# Patient Record
Sex: Male | Born: 1975
Health system: Southern US, Community
[De-identification: ages and names within clinical notes are randomized; demographics above are authoritative.]

## PROBLEM LIST (undated history)

## (undated) DIAGNOSIS — B001 Herpesviral vesicular dermatitis: Secondary | ICD-10-CM

## (undated) HISTORY — DX: Herpesviral vesicular dermatitis: B00.1

## (undated) HISTORY — PX: HERNIA REPAIR: SHX51

## (undated) HISTORY — PX: WISDOM TOOTH EXTRACTION: SHX21

## (undated) HISTORY — PX: INGUINAL HERNIA REPAIR: SUR1180

## (undated) HISTORY — PX: UMBILICAL HERNIA REPAIR: SUR1181

## (undated) HISTORY — PX: KIDNEY SURGERY: SHX687

---

## 2004-08-06 ENCOUNTER — Encounter: Admission: RE | Admit: 2004-08-06 | Discharge: 2004-08-06 | Payer: Self-pay | Admitting: Occupational Medicine

## 2008-10-20 ENCOUNTER — Ambulatory Visit: Payer: Self-pay | Admitting: Occupational Medicine

## 2008-10-20 DIAGNOSIS — B001 Herpesviral vesicular dermatitis: Secondary | ICD-10-CM

## 2008-10-20 HISTORY — DX: Herpesviral vesicular dermatitis: B00.1

## 2012-01-24 DIAGNOSIS — E349 Endocrine disorder, unspecified: Secondary | ICD-10-CM | POA: Insufficient documentation

## 2016-03-31 ENCOUNTER — Emergency Department: Payer: Self-pay

## 2016-03-31 ENCOUNTER — Emergency Department (INDEPENDENT_AMBULATORY_CARE_PROVIDER_SITE_OTHER): Payer: Self-pay

## 2016-03-31 ENCOUNTER — Emergency Department (INDEPENDENT_AMBULATORY_CARE_PROVIDER_SITE_OTHER)
Admission: EM | Admit: 2016-03-31 | Discharge: 2016-03-31 | Disposition: A | Discharge status: Home / Self Care | Payer: Managed Care, Other (non HMO) | Source: Home / Self Care | Attending: Emergency Medicine | Admitting: Emergency Medicine

## 2016-03-31 ENCOUNTER — Encounter: Payer: Self-pay | Admitting: Emergency Medicine

## 2016-03-31 DIAGNOSIS — S2222XA Fracture of body of sternum, initial encounter for closed fracture: Secondary | ICD-10-CM

## 2016-03-31 DIAGNOSIS — S2220XA Unspecified fracture of sternum, initial encounter for closed fracture: Secondary | ICD-10-CM | POA: Diagnosis not present

## 2016-03-31 DIAGNOSIS — X58XXXA Exposure to other specified factors, initial encounter: Secondary | ICD-10-CM

## 2016-03-31 DIAGNOSIS — R0781 Pleurodynia: Secondary | ICD-10-CM

## 2016-03-31 MED ORDER — HYDROCODONE-ACETAMINOPHEN 5-325 MG PO TABS: 1.0000 | ORAL_TABLET | ORAL | Status: DC | PRN
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
## 2016-04-12 ENCOUNTER — Ambulatory Visit (INDEPENDENT_AMBULATORY_CARE_PROVIDER_SITE_OTHER): Payer: Managed Care, Other (non HMO) | Admitting: Family Medicine

## 2016-04-12 ENCOUNTER — Ambulatory Visit (INDEPENDENT_AMBULATORY_CARE_PROVIDER_SITE_OTHER): Payer: Managed Care, Other (non HMO)

## 2016-04-12 ENCOUNTER — Encounter: Payer: Self-pay | Admitting: Family Medicine

## 2016-04-12 VITALS — BP 120/82 | HR 74 | Ht 71.0 in | Wt 174.0 lb

## 2016-04-12 DIAGNOSIS — M419 Scoliosis, unspecified: Secondary | ICD-10-CM

## 2016-04-12 DIAGNOSIS — M542 Cervicalgia: Secondary | ICD-10-CM

## 2016-04-12 DIAGNOSIS — S2220XD Unspecified fracture of sternum, subsequent encounter for fracture with routine healing: Secondary | ICD-10-CM | POA: Diagnosis not present

## 2016-04-12 DIAGNOSIS — M25519 Pain in unspecified shoulder: Secondary | ICD-10-CM

## 2016-04-12 DIAGNOSIS — E291 Testicular hypofunction: Secondary | ICD-10-CM

## 2016-04-12 DIAGNOSIS — E349 Endocrine disorder, unspecified: Secondary | ICD-10-CM

## 2016-04-12 DIAGNOSIS — S2220XA Unspecified fracture of sternum, initial encounter for closed fracture: Secondary | ICD-10-CM | POA: Insufficient documentation

## 2016-04-12 NOTE — Patient Instructions (Signed)
Thank you for coming in today. Get early morning fasting labs soon.  Attend PT.  Get xray today.  Recheck back and neck in 1-2 months.  Continue naproxen for pain.  Take prilosec or pepcid to protect your stomach from ulcers.
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
## 2016-04-12 NOTE — Assessment & Plan Note (Signed)
Still tender. Continue naproxen. Recheck x-ray today.

## 2016-04-12 NOTE — Progress Notes (Signed)
Quick Note:  Xray sternum shows a continued fracture which is expected. Back and Neck Xray are pretty normal. ______

## 2016-04-12 NOTE — Assessment & Plan Note (Signed)
Check testosterone TSH LH FSH. Additionally obtain other fasting labs.

## 2016-04-12 NOTE — Assessment & Plan Note (Signed)
Neck and back pain likely myofascial. Check x-ray T-spine and C-spine. Refer to physical therapy. Use naproxen for pain

## 2016-04-16 LAB — COMPREHENSIVE METABOLIC PANEL
ALBUMIN: 4.2 g/dL (ref 3.6–5.1)
ALK PHOS: 141 U/L — AB (ref 40–115)
ALT: 27 U/L (ref 9–46)
AST: 19 U/L (ref 10–40)
BILIRUBIN TOTAL: 0.8 mg/dL (ref 0.2–1.2)
BUN: 15 mg/dL (ref 7–25)
CALCIUM: 9.5 mg/dL (ref 8.6–10.3)
CO2: 26 mmol/L (ref 20–31)
CREATININE: 1.05 mg/dL (ref 0.60–1.35)
Chloride: 105 mmol/L (ref 98–110)
Glucose, Bld: 90 mg/dL (ref 65–99)
Potassium: 4.1 mmol/L (ref 3.5–5.3)
SODIUM: 140 mmol/L (ref 135–146)
Total Protein: 7.5 g/dL (ref 6.1–8.1)

## 2016-04-16 LAB — CBC
HCT: 43.9 % (ref 38.5–50.0)
Hemoglobin: 15.2 g/dL (ref 13.2–17.1)
MCH: 31.9 pg (ref 27.0–33.0)
MCHC: 34.6 g/dL (ref 32.0–36.0)
MCV: 92 fL (ref 80.0–100.0)
MPV: 11.8 fL (ref 7.5–12.5)
Platelets: 253 10*3/uL (ref 140–400)
RBC: 4.77 MIL/uL (ref 4.20–5.80)
RDW: 13.1 % (ref 11.0–15.0)
WBC: 6.8 10*3/uL (ref 3.8–10.8)

## 2016-04-16 LAB — HEMOGLOBIN A1C
HEMOGLOBIN A1C: 5.3 % (ref <5.7)
Mean Plasma Glucose: 105 mg/dL

## 2016-04-16 LAB — LIPID PANEL
Cholesterol: 195 mg/dL (ref 125–200)
HDL: 44 mg/dL (ref >=40)
LDL CALC: 129 mg/dL (ref <130)
TRIGLYCERIDES: 112 mg/dL (ref <150)
Total CHOL/HDL Ratio: 4.4 Ratio (ref <=5.0)
VLDL: 22 mg/dL (ref <30)

## 2016-04-16 LAB — TESTOSTERONE: Testosterone: 412 ng/dL (ref 250–827)

## 2016-04-16 LAB — TSH: TSH: 2.03 mIU/L (ref 0.40–4.50)

## 2016-04-16 LAB — LUTEINIZING HORMONE: LH: 5.6 m[IU]/mL (ref 1.5–9.3)

## 2016-04-16 LAB — VITAMIN D 25 HYDROXY (VIT D DEFICIENCY, FRACTURES): Vit D, 25-Hydroxy: 24 ng/mL — ABNORMAL LOW (ref 30–100)

## 2016-04-16 LAB — FOLLICLE STIMULATING HORMONE: FSH: 6.1 m[IU]/mL (ref 1.6–8.0)

## 2016-04-17 ENCOUNTER — Encounter: Payer: Self-pay | Admitting: Family Medicine

## 2016-04-17 DIAGNOSIS — E559 Vitamin D deficiency, unspecified: Secondary | ICD-10-CM | POA: Insufficient documentation

## 2016-04-17 NOTE — Progress Notes (Signed)
Quick Note:  Vitamin D deficiency noted. Take 2000 units of vitamin D daily over-the-counter.  Testosterone is normal.  Cholesterol is a bit high. Work on diet and weight loss and exercise.   ______

## 2016-04-20 ENCOUNTER — Ambulatory Visit (INDEPENDENT_AMBULATORY_CARE_PROVIDER_SITE_OTHER): Payer: Managed Care, Other (non HMO) | Admitting: Rehabilitative and Restorative Service Providers"

## 2016-04-20 ENCOUNTER — Encounter: Payer: Self-pay | Admitting: Rehabilitative and Restorative Service Providers"

## 2016-04-20 DIAGNOSIS — M791 Myalgia, unspecified site: Secondary | ICD-10-CM

## 2016-04-20 DIAGNOSIS — R293 Abnormal posture: Secondary | ICD-10-CM

## 2016-04-20 DIAGNOSIS — R29898 Other symptoms and signs involving the musculoskeletal system: Secondary | ICD-10-CM
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
## 2016-04-20 NOTE — Patient Instructions (Signed)
Axial Extension (Chin Tuck)   Can do this lying and sitting  Pull chin in and lengthen back of neck. Hold __10__ seconds while counting out loud. Repeat __10__ times. Do __several __ sessions per day.   Shoulder Blade Squeeze    Rotate shoulders back, then squeeze shoulder blades down and back. Hold 10 sec Repeat __10__ times. Do __several__ sessions per day. Can use swim noodle along spine  Abdominal Bracing With Pelvic Floor (Hook-Lying)    With neutral spine, tighten pelvic floor and abdominals sucking belly button to back bone; tighten muscles in back at waist. Hold 10 sec  Repeat _10__ times. Do _several __ times a day.   HIP: Hamstrings - Supine    Place strap around foot. Raise leg up, keep knee straight. Hold _30__ seconds. _3__ reps per set, _2 times per day  TENS UNIT: This is helpful for muscle pain and spasm.   Search and Purchase a TENS 7000 2nd edition at www.tenspros.com. It should be less than $30.     TENS unit instructions: Do not shower or bathe with the unit on Turn the unit off before removing electrodes or batteries If the electrodes lose stickiness add a drop of water to the electrodes after they are disconnected from the unit and place on plastic sheet. If you continued to have difficulty, call the TENS unit company to purchase more electrodes. Do not apply lotion on the skin area prior to use. Make sure the skin is clean and dry as this will help prolong the life of the electrodes. After use, always check skin for unusual red areas, rash or other skin difficulties. If there are any skin problems, does not apply electrodes to the same area. Never remove the electrodes from the unit by pulling the wires. Do not use the TENS unit or electrodes other than as directed. Do not change electrode placement without consultating your therapist or physician. Keep 2 fingers with between each electrode.   Sleeping on Back  Place pillow under knees. A  pillow with cervical support and a roll around waist are also helpful. Copyright  VHI. All rights reserved.  Sleeping on Side Place pillow between knees. Use cervical support under neck and a roll around waist as needed. Copyright  VHI. All rights reserved.   Sleeping on Stomach   If this is the only desirable sleeping position, place pillow under lower legs, and under stomach or chest as needed.  Posture - Sitting   Sit upright, head facing forward. Try using a roll to support lower back. Keep shoulders relaxed, and avoid rounded back. Keep hips level with knees. Avoid crossing legs for long periods. Stand to Sit / Sit to Stand   To sit: Bend knees to lower self onto front edge of chair, then scoot back on seat. To stand: Reverse sequence by placing one foot forward, and scoot to front of seat. Use rocking motion to stand up.   Work Height and Reach  Ideal work height is no more than 2 to 4 inches below elbow level when standing, and at elbow level when sitting. Reaching should be limited to arm's length, with elbows slightly bent.  Bending  Bend at hips and knees, not back. Keep feet shoulder-width apart.    Posture - Standing   Good posture is important. Avoid slouching and forward head thrust. Maintain curve in low back and align ears over shoul- ders, hips over ankles.  Alternating Positions   Alternate tasks and change positions  frequently to reduce fatigue and muscle tension. Take rest breaks. Computer Work   Position work to Programmer, multimedia. Use proper work and seat height. Keep shoulders back and down, wrists straight, and elbows at right angles. Use chair that provides full back support. Add footrest and lumbar roll as needed.  Getting Into / Out of Car  Lower self onto seat, scoot back, then bring in one leg at a time. Reverse sequence to get out.  Dressing  Lie on back to pull socks or slacks over feet, or sit and bend leg while keeping back straight.     Housework - Sink  Place one foot on ledge of cabinet under sink when standing at sink for prolonged periods.   Pushing / Pulling  Pushing is preferable to pulling. Keep back in proper alignment, and use leg muscles to do the work.  Deep Squat   Squat and lift with both arms held against upper trunk. Tighten stomach muscles without holding breath. Use smooth movements to avoid jerking.  Avoid Twisting   Avoid twisting or bending back. Pivot around using foot movements, and bend at knees if needed when reaching for articles.  Carrying Luggage   Distribute weight evenly on both sides. Use a cart whenever possible. Do not twist trunk. Move body as a unit.   Lifting Principles .Maintain proper posture and head alignment. .Slide object as close as possible before lifting. .Move obstacles out of the way. .Test before lifting; ask for help if too heavy. .Tighten stomach muscles without holding breath. .Use smooth movements; do not jerk. .Use legs to do the work, and pivot with feet. .Distribute the work load symmetrically and close to the center of trunk. .Push instead of pull whenever possible.   Ask For Help   Ask for help and delegate to others when possible. Coordinate your movements when lifting together, and maintain the low back curve.  Log Roll   Lying on back, bend left knee and place left arm across chest. Roll all in one movement to the right. Reverse to roll to the left. Always move as one unit. Housework - Sweeping  Use long-handled equipment to avoid stooping.   Housework - Wiping  Position yourself as close as possible to reach work surface. Avoid straining your back.  Laundry - Unloading Wash   To unload small items at bottom of washer, lift leg opposite to arm being used to reach.  [place] close to area to be raked. Use arm movements to do the work. Keep back straight and avoid twisting.     Cart  When reaching into cart  with one arm, lift opposite leg to keep back straight.   Getting Into / Out of Bed  Lower self to lie down on one side by raising legs and lowering head at the same time. Use arms to assist moving without twisting. Bend both knees to roll onto back if desired. To sit up, start from lying on side, and use same move-ments in reverse. Housework - Vacuuming  Hold the vacuum with arm held at side. Step back and forth to move it, keeping head up. Avoid twisting.   Laundry - IT consultant so that bending and twisting can be avoided.   Laundry - Unloading Dryer  Squat down to reach into clothes dryer or use a reacher.  Gardening - Weeding / Probation officer or Kneel. Knee pads may be helpful.

## 2016-04-26 ENCOUNTER — Ambulatory Visit (INDEPENDENT_AMBULATORY_CARE_PROVIDER_SITE_OTHER): Payer: Managed Care, Other (non HMO) | Admitting: Physical Therapy

## 2016-04-26 DIAGNOSIS — M791 Myalgia, unspecified site: Secondary | ICD-10-CM

## 2016-04-26 DIAGNOSIS — R293 Abnormal posture: Secondary | ICD-10-CM

## 2016-04-26 DIAGNOSIS — R29898 Other symptoms and signs involving the musculoskeletal system: Secondary | ICD-10-CM

## 2016-04-26 NOTE — Patient Instructions (Addendum)
Supine With Rotation    Lie, back flat, legs bent, feet together. Rotate knees to one side. Hold __5-10_ seconds. Repeat to other side. Repeat _5-10__ times per session. Do _1-2__ sessions per day.   Side Pull: Double Arm   On back, knees bent, feet flat. Arms perpendicular to body, shoulder level, elbows straight but relaxed. Pull arms out to sides, elbows straight. Resistance band comes across collarbones, hands toward floor. Hold momentarily. Slowly return to starting position. Repeat _10__ times. Band color __green___   Sash   On back, knees bent, feet flat, left hand on left hip, right hand above left. Pull right arm DIAGONALLY (hip to shoulder) across chest. Bring right arm along head toward floor. Hold momentarily. Slowly return to starting position. Thumb up like hitchhiking.  Repeat _10__ times, 2 sets. Do with left arm. Band color _green_____   [facility] [place], [place] [address]  [phone] (office) [phone] (fax)

## 2016-04-26 NOTE — Therapy (Signed)
[place], [place], [address] Phone: [phone]   Fax:  [phone]  Physical Therapy Treatment  Patient Details  Name: Allen Mcpherson MRN: [id number] Date of Birth: 1976-04-20 Referring Provider: Dr. Georgina Snell   Encounter Date: 04/26/2016      PT End of Session - 04/26/16 0803    Visit Number 2   Number of Visits 12   Date for PT Re-Evaluation 06/01/16   PT Start Time 0803   PT Stop Time 0853   PT Time Calculation (min) 50 min      No past medical history on file.  Past Surgical History  Procedure Laterality Date  . Kidney surgery    . Hernia repair      There were no vitals filed for this visit.      Subjective Assessment - 04/26/16 0804    Subjective Pt reports he performed snow angels a few times since last visit. "I've been slacking" regarding HEP.  Pt continues to have pain with coughing and sneezing.     Currently in Pain? Yes   Pain Score 4    Pain Location Neck   Pain Orientation Lower;Left;Right   Pain Descriptors / Indicators Aching   Aggravating Factors  sitting too long    Pain Relieving Factors moving around             [facility] PT Assessment - 04/26/16 0001    Assessment   Medical Diagnosis Fracture of sternum; neck pain; LBP    Referring Provider Dr. Georgina Snell    Onset Date/Surgical Date 03/25/16   Hand Dominance Right   Next MD Visit none secheduled    Prior Therapy none           [facility] Adult PT Treatment/Exercise - 04/26/16 0001    Exercises   Exercises Shoulder;Neck;Lumbar   Neck Exercises: Seated   Neck Retraction 10 reps;3 secs  tactile cues to correct form.   Lumbar Exercises: Stretches   Passive Hamstring Stretch 2 reps;30 seconds  supine with strap    Passive Hamstring Stretch Limitations then 2 reps each leg seated    Lower Trunk Rotation 5 reps;10 seconds  with arms in T   Piriformis Stretch 2 reps;30 seconds  seated    Lumbar Exercises:  Supine   Other Supine Lumbar Exercises Hooklying on 1/2 foam roll:  snow angel position static for 2 min, then 10 reps of abd ~ 120 deg   Shoulder Exercises: Supine   Other Supine Exercises Scap squeeze x 5 sec hold x 10 reps   Other Supine Exercises D2 flexion with red band x 5 reps each arm, then 5 reps with green band;  bilat horiz abd with green band x 10 reps    Moist Heat Therapy   Number Minutes Moist Heat 15 Minutes   Moist Heat Location Cervical;Lumbar Spine  mid back   Electrical Stimulation   Electrical Stimulation Location bilat lower cervical paraspinals and upper thoracic paraspinals   Electrical Stimulation Action IFC   Electrical Stimulation Parameters to tolerance    Electrical Stimulation Goals Pain;Tone                PT Education - 04/26/16 [phone]    Education provided Yes   Education Details HEP    Person(s) Educated Patient   Methods Explanation;Handout   Comprehension Verbalized understanding;Returned demonstration             PT Long Term Goals - 04/20/16 0827  PT LONG TERM GOAL #1   Title Improve posture and alignment with patient to demonstrate upright posture through cervical and thoracic spine with improved position of scapulae along the thoracic wall   06/01/16   Time 6   Period Weeks   Status New   PT LONG TERM GOAL #2   Title Cervical ROM WNL's and painfree   06/01/16   Time 6   Period Weeks   Status New   PT LONG TERM GOAL #3   Title Patient reports that he can cough and sneeze without chest pain 06/01/16   Time 6   Period Weeks   Status New   PT LONG TERM GOAL #4   Title Independent in HEP 06/01/16   Time 6   Period Weeks   Status New   PT LONG TERM GOAL #5   Title Improve FOTO to </= 25% limitation  06/01/16   Time 6   Period Weeks   Status New               Plan - 04/26/16 [id number]    Clinical Impression Statement Pt tolerated all exercises, reporting some relief of symptoms afterwards.  Pain further reduced with  use of estim and MHP.  Pt encouraged to complete HEP outside of therapy session so as to meet his established goals.  Progressing towards goals.    Rehab Potential Good   PT Frequency 2x / week   PT Duration 6 weeks   PT Treatment/Interventions Patient/family education;ADLs/Self Care Home Management;Neuromuscular re-education;Manual techniques;Dry needling;Cryotherapy;Electrical Stimulation;Iontophoresis 4mg /ml Dexamethasone;Moist Heat;Ultrasound;Traction;Therapeutic activities;Therapeutic exercise   PT Next Visit Plan postural correction/education; ROM; strengthening; maunal work; modalities as indicated      Patient will benefit from skilled therapeutic intervention in order to improve the following deficits and impairments:  Postural dysfunction, Improper body mechanics, Pain, Increased fascial restricitons, Increased muscle spasms, Decreased range of motion, Decreased strength, Decreased mobility, Decreased endurance, Decreased activity tolerance  Visit Diagnosis: Other symptoms and signs involving the musculoskeletal system  Abnormal posture  Myalgia     Problem List Patient Active Problem List   Diagnosis Date Noted  . Vitamin D deficiency 04/17/2016  . Fracture, sternum closed 04/12/2016  . Neck pain 04/12/2016  . HERPES LABIALIS 10/20/2008   Allen Mcpherson, PTA 04/26/2016 8:52 AM  [facility] [place], [place], [address] Phone: [phone]   Fax:  [phone]  Name: Allen Mcpherson MRN: [id number] Date of Birth: [birth year]

## 2016-04-28 ENCOUNTER — Encounter: Payer: Self-pay | Admitting: Rehabilitative and Restorative Service Providers"

## 2016-04-28 ENCOUNTER — Ambulatory Visit (INDEPENDENT_AMBULATORY_CARE_PROVIDER_SITE_OTHER): Payer: Managed Care, Other (non HMO) | Admitting: Rehabilitative and Restorative Service Providers"

## 2016-04-28 DIAGNOSIS — R293 Abnormal posture: Secondary | ICD-10-CM | POA: Diagnosis not present

## 2016-04-28 DIAGNOSIS — R29898 Other symptoms and signs involving the musculoskeletal system: Secondary | ICD-10-CM

## 2016-04-28 DIAGNOSIS — M791 Myalgia, unspecified site: Secondary | ICD-10-CM

## 2016-04-28 NOTE — Patient Instructions (Addendum)
Scapula Adduction With Pectoralis Stretch: Low - Standing   Shoulders at 45 hands even with shoulders, keeping weight through legs, shift weight forward until you feel pull or stretch through the front of your chest. Hold _30__ seconds. Do _3__ times, _2-4__ times per day.   Scapula Adduction With Pectoralis Stretch: Mid-Range - Standing   Shoulders at 90 elbows even with shoulders, keeping weight through legs, shift weight forward until you feel pull or strength through the front of your chest. Hold __30_ seconds. Do _3__ times, __2-4_ times per day.   Scapula Adduction With Pectoralis Stretch: High - Standing   Shoulders at 120 hands up high on the doorway, keeping weight on feet, shift weight forward until you feel pull or stretch through the front of your chest. Hold _30__ seconds. Do _3__ times, _2-3__ times per day.  Resisted External Rotation: in Neutral - Bilateral   PALMS UP Sit or stand, tubing in both hands, elbows at sides, bent to 90, forearms forward. Pinch shoulder blades together and rotate forearms out. Keep elbows at sides. Repeat __10__ times per set. Do _2-3___ sets per session. Do _2-3___ sessions per day.

## 2016-04-28 NOTE — Therapy (Signed)
[place], [place], [address] Phone: [phone]   Fax:  [phone]  Physical Therapy Treatment  Patient Details  Name: Allen Mcpherson MRN: [id number] Date of Birth: 1976/06/27 Referring Provider: Dr. Georgina Snell   Encounter Date: 04/28/2016      PT End of Session - 04/28/16 0811    Visit Number 3   Number of Visits 12   Date for PT Re-Evaluation 06/01/16   PT Start Time 0805   PT Stop Time 0858   PT Time Calculation (min) 53 min   Activity Tolerance Patient tolerated treatment well      History reviewed. No pertinent past medical history.  Past Surgical History  Procedure Laterality Date  . Kidney surgery    . Hernia repair      There were no vitals filed for this visit.      Subjective Assessment - 04/28/16 0812    Subjective Patient reports that he had a "sneezing attack" yesterday and had increased pain in the mid back area.    Currently in Pain? Yes   Pain Score 3    Pain Location Back  btn shoulder blades    Pain Orientation Mid   Pain Descriptors / Indicators Aching   Pain Type Acute pain   Pain Onset More than a month ago   Pain Frequency Intermittent                         [facility] Adult PT Treatment/Exercise - 04/28/16 0001    Therapeutic Activites    Therapeutic Activities --  myofacial release with golf balls occipital area   Neck Exercises: Theraband   Scapula Retraction 20 reps  yellow TB with noodle    Neck Exercises: Supine   Neck Retraction 10 reps;10 secs   Shoulder Exercises: Standing   Retraction Both;20 reps;Theraband   Theraband Level (Shoulder Retraction) Level 1 (Yellow)  with noodle    Other Standing Exercises scap squeeze with noodle 10 sec x 10    Shoulder Exercises: ROM/Strengthening   Other ROM/Strengthening Exercises Nustep L4 arms and legs x5 min    Moist Heat Therapy   Number Minutes Moist Heat 15 Minutes   Moist Heat Location  Cervical;Lumbar Spine  mid back   Electrical Stimulation   Electrical Stimulation Location bilat lower cervical paraspinals and upper thoracic paraspinals   Electrical Stimulation Action IFC   Electrical Stimulation Parameters to tolerance   Electrical Stimulation Goals Pain;Tone   Manual Therapy   Manual therapy comments pt prone   Soft tissue mobilization through the cervical and thoraic paraspinals and cervical musculature    Myofascial Release thoracic spine - mid scapular area    Passive ROM gentle cervical stretch laterally   Manual Traction manual traction mod pull 5 reps x 20-30 sec                 PT Education - 04/28/16 0818    Education provided Yes   Education Details HEP   Person(s) Educated Patient   Methods Explanation;Demonstration;Tactile cues;Verbal cues;Handout   Comprehension Verbalized understanding;Returned demonstration;Verbal cues required;Tactile cues required             PT Long Term Goals - 04/28/16 0813    PT LONG TERM GOAL #1   Title Improve posture and alignment with patient to demonstrate upright posture through cervical and thoracic spine with improved position of scapulae along the thoracic wall   06/01/16  Time 6   Period Weeks   Status On-going   PT LONG TERM GOAL #2   Title Cervical ROM WNL's and painfree   06/01/16   Time 6   Period Weeks   Status On-going   PT LONG TERM GOAL #3   Title Patient reports that he can cough and sneeze without chest pain 06/01/16   Time 6   Period Weeks   Status On-going   PT LONG TERM GOAL #4   Title Independent in HEP 06/01/16   Time 6   Period Weeks   Status On-going   PT LONG TERM GOAL #5   Title Improve FOTO to </= 25% limitation  06/01/16   Time 6   Period Weeks   Status On-going               Plan - 04/28/16 [id number]    Clinical Impression Statement Progressing with exercises - persistent symptoms but progressing toward stated goals of therapy.    Rehab Potential Good   PT  Frequency 2x / week   PT Duration 6 weeks   PT Treatment/Interventions Patient/family education;ADLs/Self Care Home Management;Neuromuscular re-education;Manual techniques;Dry needling;Cryotherapy;Electrical Stimulation;Iontophoresis 4mg /ml Dexamethasone;Moist Heat;Ultrasound;Traction;Therapeutic activities;Therapeutic exercise   PT Next Visit Plan postural correction/education; ROM; strengthening; maunal work; modalities as indicated   PT Home Exercise Plan HEP; myofacial release work   Oncologist with Plan of Care Patient      Patient will benefit from skilled therapeutic intervention in order to improve the following deficits and impairments:  Postural dysfunction, Improper body mechanics, Pain, Increased fascial restricitons, Increased muscle spasms, Decreased range of motion, Decreased strength, Decreased mobility, Decreased endurance, Decreased activity tolerance  Visit Diagnosis: Other symptoms and signs involving the musculoskeletal system  Abnormal posture  Myalgia     Problem List Patient Active Problem List   Diagnosis Date Noted  . Vitamin D deficiency 04/17/2016  . Fracture, sternum closed 04/12/2016  . Neck pain 04/12/2016  . HERPES LABIALIS 10/20/2008    Celyn Nilda Simmer PT, MPH  04/28/2016, 8:48 AM  [facility] [place], [place], [address] Phone: [phone]   Fax:  [phone]  Name: Allen Mcpherson MRN: [id number] Date of Birth: 1975/12/27

## 2016-05-03 ENCOUNTER — Encounter (INDEPENDENT_AMBULATORY_CARE_PROVIDER_SITE_OTHER): Payer: Self-pay

## 2016-05-03 ENCOUNTER — Encounter: Payer: Self-pay | Admitting: Rehabilitative and Restorative Service Providers"

## 2016-05-03 ENCOUNTER — Ambulatory Visit (INDEPENDENT_AMBULATORY_CARE_PROVIDER_SITE_OTHER): Payer: Managed Care, Other (non HMO) | Admitting: Rehabilitative and Restorative Service Providers"

## 2016-05-03 DIAGNOSIS — R29898 Other symptoms and signs involving the musculoskeletal system: Secondary | ICD-10-CM | POA: Diagnosis not present

## 2016-05-03 DIAGNOSIS — M791 Myalgia, unspecified site: Secondary | ICD-10-CM

## 2016-05-03 DIAGNOSIS — R293 Abnormal posture: Secondary | ICD-10-CM | POA: Diagnosis not present

## 2016-05-03 NOTE — Patient Instructions (Addendum)
Resisted External Rotation: in Neutral - Bilateral   PALMS UP Sit or stand, tubing in both hands, elbows at sides, bent to 90, forearms forward. Pinch shoulder blades together and rotate forearms out. Keep elbows at sides. Repeat __10__ times per set. Do _2-3___ sets per session. Do _2-3___ sessions per day.   Low Row: Standing   Face anchor, feet shoulder width apart. Palms up, pull arms back, squeezing shoulder blades together. Repeat 10__ times per set. Do 2-3__ sets per session. Do 2-3__ sessions per week. Anchor Height: Waist   Strengthening: Resisted Extension   Hold tubing in right hand, arm forward. Pull arm back, elbow straight. Repeat _10___ times per set. Do 2-3____ sets per session. Do 2-3____ sessions per day.  Cat / Cow Flow    Inhale, press spine toward ceiling like a Halloween cat. Keeping strength in arms and abdominals, exhale to soften spine through neutral and into cow pose. Open chest and arch back. Initiate movement between cat and cow at tailbone, one vertebrae at a time. Repeat __5__ times.   Trigger Point Dry Needling  . What is Trigger Point Dry Needling (DN)? o DN is a physical therapy technique used to treat muscle pain and dysfunction. Specifically, DN helps deactivate muscle trigger points (muscle knots).  o A thin filiform needle is used to penetrate the skin and stimulate the underlying trigger point. The goal is for a local twitch response (LTR) to occur and for the trigger point to relax. No medication of any kind is injected during the procedure.   . What Does Trigger Point Dry Needling Feel Like?  o The procedure feels different for each individual patient. Some patients report that they do not actually feel the needle enter the skin and overall the process is not painful. Very mild bleeding may occur. However, many patients feel a deep cramping in the muscle in which the needle was inserted. This is the local twitch response.   Marland Kitchen How Will I  feel after the treatment? o Soreness is normal, and the onset of soreness may not occur for a few hours. Typically this soreness does not last longer than two days.  o Bruising is uncommon, however; ice can be used to decrease any possible bruising.  o In rare cases feeling tired or nauseous after the treatment is normal. In addition, your symptoms may get worse before they get better, this period will typically not last longer than 24 hours.   . What Can I do After My Treatment? o Increase your hydration by drinking more water for the next 24 hours. o You may place ice or heat on the areas treated that have become sore, however, do not use heat on inflamed or bruised areas. Heat often brings more relief post needling. o You can continue your regular activities, but vigorous activity is not recommended initially after the treatment for 24 hours. o DN is best combined with other physical therapy such as strengthening, stretching, and other therapies.

## 2016-05-03 NOTE — Therapy (Signed)
[place], [place], [address] Phone: [phone]   Fax:  [phone]  Physical Therapy Treatment  Patient Details  Name: Allen Mcpherson MRN: [id number] Date of Birth: 1975/12/19 Referring Provider: Dr. Georgina Snell   Encounter Date: 05/03/2016      PT End of Session - 05/03/16 0807    Visit Number 4   Number of Visits 12   Date for PT Re-Evaluation 06/01/16   PT Start Time 0805   PT Stop Time 0858   PT Time Calculation (min) 53 min   Activity Tolerance Patient tolerated treatment well      History reviewed. No pertinent past medical history.  Past Surgical History  Procedure Laterality Date  . Kidney surgery    . Hernia repair      There were no vitals filed for this visit.      Subjective Assessment - 05/03/16 0808    Subjective Patient reports that he continues to improve. The last treatment "when you stretched me out helped a lot". He is doing his exercises at home without difficutly.    Currently in Pain? Yes   Pain Score 2    Pain Location Back   Pain Orientation Mid   Pain Descriptors / Indicators Aching   Pain Type Acute pain   Pain Onset More than a month ago   Pain Frequency Intermittent                         [facility] Adult PT Treatment/Exercise - 05/03/16 0001    Neck Exercises: Theraband   Scapula Retraction 20 reps  yellow TB with noodle    Lumbar Exercises: Stretches   Passive Hamstring Stretch 2 reps;30 seconds   Passive Hamstring Stretch Limitations then 2 reps each leg seated    Piriformis Stretch 2 reps;30 seconds  seated   Lumbar Exercises: Quadruped   [place]/Old Horse 5 reps   Shoulder Exercises: Standing   Extension Strengthening;Both;20 reps;Theraband   Theraband Level (Shoulder Extension) Level 2 (Red)   Row Strengthening;Both;20 reps;Theraband   Theraband Level (Shoulder Row) Level 2 (Red)   Retraction Both;20 reps;Theraband   Theraband  Level (Shoulder Retraction) Level 1 (Yellow)  with noodle    Other Standing Exercises scap squeeze with noodle 10 sec x 10    Shoulder Exercises: ROM/Strengthening   Other ROM/Strengthening Exercises Nustep L5 arms and legs x5 min    Moist Heat Therapy   Number Minutes Moist Heat 15 Minutes   Moist Heat Location Cervical;Lumbar Spine  mid back   Electrical Stimulation   Electrical Stimulation Location bilat lower cervical paraspinals and upper thoracic paraspinals   Electrical Stimulation Action IFC   Electrical Stimulation Parameters to tolerance   Electrical Stimulation Goals Pain;Tone   Manual Therapy   Manual therapy comments pt prone   Soft tissue mobilization through the cervical and thoraic paraspinals and cervical musculature    Myofascial Release thoracic spine - mid scapular area    Passive ROM gentle cervical stretch laterally   Manual Traction manual traction mod pull 5 reps x 20-30 sec                 PT Education - 05/03/16 0818    Education provided Yes   Education Details HEP   Person(s) Educated Patient   Methods Explanation;Demonstration;Tactile cues;Verbal cues;Handout   Comprehension Verbalized understanding;Returned demonstration;Verbal cues required;Tactile cues required  PT Long Term Goals - 05/03/16 0810    PT LONG TERM GOAL #1   Title Improve posture and alignment with patient to demonstrate upright posture through cervical and thoracic spine with improved position of scapulae along the thoracic wall   06/01/16   Time 6   Period Weeks   Status On-going   PT LONG TERM GOAL #2   Title Cervical ROM WNL's and painfree   06/01/16   Time 6   Period Weeks   Status On-going   PT LONG TERM GOAL #3   Title Patient reports that he can cough and sneeze without chest pain 06/01/16   Time 6   Period Weeks   Status On-going   PT LONG TERM GOAL #4   Title Independent in HEP 06/01/16   Time 6   Period Weeks   Status On-going   PT LONG  TERM GOAL #5   Title Improve FOTO to </= 25% limitation  06/01/16   Time 6   Period Weeks   Status On-going               Plan - 05/03/16 [id number]    Clinical Impression Statement Improving with treatment and HEP. Progressing radually toward stated goals of therapy. Excellent response to manual work    Rehab Potential Good   PT Frequency 2x / week   PT Duration 6 weeks   PT Treatment/Interventions Patient/family education;ADLs/Self Care Home Management;Neuromuscular re-education;Manual techniques;Dry needling;Cryotherapy;Electrical Stimulation;Iontophoresis 4mg /ml Dexamethasone;Moist Heat;Ultrasound;Traction;Therapeutic activities;Therapeutic exercise   PT Next Visit Plan postural correction/education; ROM; strengthening; maunal work; modalities as indicated consider TDN as indicated traps Lt and thoracic paraspinals   PT Home Exercise Plan HEP; myofacial release work   Oncologist with Plan of Care Patient      Patient will benefit from skilled therapeutic intervention in order to improve the following deficits and impairments:  Postural dysfunction, Improper body mechanics, Pain, Increased fascial restricitons, Increased muscle spasms, Decreased range of motion, Decreased strength, Decreased mobility, Decreased endurance, Decreased activity tolerance  Visit Diagnosis: Other symptoms and signs involving the musculoskeletal system  Abnormal posture  Myalgia     Problem List Patient Active Problem List   Diagnosis Date Noted  . Vitamin D deficiency 04/17/2016  . Fracture, sternum closed 04/12/2016  . Neck pain 04/12/2016  . HERPES LABIALIS 10/20/2008    Aariel Ems Nilda Simmer PT, MPH 05/03/2016, 8:49 AM  [place], [place], [address] Phone: [phone]   Fax:  [phone]  Name: Allen Mcpherson MRN: [id number] Date of Birth: 16-Jul-1976

## 2016-05-05 ENCOUNTER — Encounter: Payer: Managed Care, Other (non HMO) | Admitting: Rehabilitative and Restorative Service Providers"

## 2016-05-10 ENCOUNTER — Encounter: Payer: Managed Care, Other (non HMO) | Admitting: Rehabilitative and Restorative Service Providers"

## 2016-05-12 ENCOUNTER — Encounter: Payer: Self-pay | Admitting: Rehabilitative and Restorative Service Providers"

## 2016-05-12 ENCOUNTER — Ambulatory Visit (INDEPENDENT_AMBULATORY_CARE_PROVIDER_SITE_OTHER): Payer: Managed Care, Other (non HMO) | Admitting: Rehabilitative and Restorative Service Providers"

## 2016-05-12 DIAGNOSIS — R293 Abnormal posture: Secondary | ICD-10-CM

## 2016-05-12 DIAGNOSIS — R29898 Other symptoms and signs involving the musculoskeletal system: Secondary | ICD-10-CM | POA: Diagnosis not present

## 2016-05-12 DIAGNOSIS — M791 Myalgia, unspecified site: Secondary | ICD-10-CM

## 2016-05-12 NOTE — Patient Instructions (Signed)
Upper / Lower Extremity Extension (All-Fours)    Tighten core and raise right leg and opposite arm. Keep trunk rigid. Repeat _10___ times per set. Do __1-2__ sets per session. Do __1__ sessions per day.   Trigger Point Dry Needling  . What is Trigger Point Dry Needling (DN)? o DN is a physical therapy technique used to treat muscle pain and dysfunction. Specifically, DN helps deactivate muscle trigger points (muscle knots).  o A thin filiform needle is used to penetrate the skin and stimulate the underlying trigger point. The goal is for a local twitch response (LTR) to occur and for the trigger point to relax. No medication of any kind is injected during the procedure.   . What Does Trigger Point Dry Needling Feel Like?  o The procedure feels different for each individual patient. Some patients report that they do not actually feel the needle enter the skin and overall the process is not painful. Very mild bleeding may occur. However, many patients feel a deep cramping in the muscle in which the needle was inserted. This is the local twitch response.   Marland Kitchen How Will I feel after the treatment? o Soreness is normal, and the onset of soreness may not occur for a few hours. Typically this soreness does not last longer than two days.  o Bruising is uncommon, however; ice can be used to decrease any possible bruising.  o In rare cases feeling tired or nauseous after the treatment is normal. In addition, your symptoms may get worse before they get better, this period will typically not last longer than 24 hours.   . What Can I do After My Treatment? o Increase your hydration by drinking more water for the next 24 hours. o You may place ice or heat on the areas treated that have become sore, however, do not use heat on inflamed or bruised areas. Heat often brings more relief post needling. o You can continue your regular activities, but vigorous activity is not recommended initially after the  treatment for 24 hours. o DN is best combined with other physical therapy such as strengthening, stretching, and other therapies.  o

## 2016-05-12 NOTE — Therapy (Signed)
[place], [place], [address] Phone: [phone]   Fax:  [phone]  Physical Therapy Treatment  Patient Details  Name: Allen Mcpherson MRN: [id number] Date of Birth: [birth year] Referring Provider: Dr. Georgina Snell   Encounter Date: 05/12/2016      PT End of Session - 05/12/16 0807    Visit Number 5   Number of Visits 12   Date for PT Re-Evaluation 06/01/16   PT Start Time 0804   PT Stop Time 0859   PT Time Calculation (min) 55 min   Activity Tolerance Patient tolerated treatment well      History reviewed. No pertinent past medical history.  Past Surgical History  Procedure Laterality Date  . Kidney surgery    . Hernia repair      There were no vitals filed for this visit.      Subjective Assessment - 05/12/16 0806    Subjective Patient reports that he continues to improve. He is doing his exercises at home without difficutly. Thinks he is "finally healing".   Currently in Pain? No/denies            [facility] PT Assessment - 05/12/16 0001    Assessment   Medical Diagnosis Fracture of sternum; neck pain; LBP    Referring Provider Dr. Georgina Snell    Onset Date/Surgical Date 03/25/16   Hand Dominance Right   Next MD Visit none secheduled    Prior Therapy none   AROM   Cervical Flexion 50   Cervical Extension 51   Cervical - Right Side Bend 41   Cervical - Left Side Bend 40   Cervical - Right Rotation 68   Cervical - Left Rotation 68   Palpation   Spinal mobility minimal tender and tight with CPA mobs C5/6/7 and L3/4/5    Palpation comment improving tenderness and tightness through ant/lat/post cervical musculature; upper traps; cervical and thoracic paraspinals; pecs                     [facility] Adult PT Treatment/Exercise - 05/12/16 0001    Neck Exercises: Theraband   Scapula Retraction 20 reps  yellow TB with noodle    Neck Exercises: Supine   Neck Retraction 10 reps;10  secs   Lumbar Exercises: Stretches   Passive Hamstring Stretch 2 reps;30 seconds   Passive Hamstring Stretch Limitations then 2 reps each leg seated    Piriformis Stretch 2 reps;30 seconds  seated   Lumbar Exercises: Aerobic   Stationary Bike Stepper L5 x 5 min arms and legs - arms 9    Lumbar Exercises: Supine   Ab Set --  3 part core 10 sec x 10    Lumbar Exercises: Quadruped   Opposite Arm/Leg Raise Right arm/Left leg;Left arm/Right leg;10 reps;2 seconds   Shoulder Exercises: Prone   Retraction Both;10 reps  row   Shoulder Exercises: Standing   Extension Strengthening;Both;20 reps;Theraband   Theraband Level (Shoulder Extension) Level 2 (Red)   Row Strengthening;Both;20 reps;Theraband   Theraband Level (Shoulder Row) Level 2 (Red)   Retraction Both;20 reps;Theraband   Theraband Level (Shoulder Retraction) Level 1 (Yellow)  with noodle    Other Standing Exercises scap squeeze with noodle 10 sec x 10    Shoulder Exercises: Stretch   Other Shoulder Stretches 3way doorway stretch 30 sec x 3 each    Moist Heat Therapy   Number Minutes Moist Heat 15 Minutes   Moist Heat Location Cervical;Lumbar Spine  mid back   Acupuncturist Location Lt lower cervical paraspinals and upper thoracic paraspinals   Electrical Stimulation Action IFC   Electrical Stimulation Parameters to tolerance   Electrical Stimulation Goals Pain;Tone   Manual Therapy   Manual therapy comments pt prone and supine   Soft tissue mobilization through the cervical and thoraic paraspinals and cervical musculature    Myofascial Release thoracic spine - mid scapular area    Passive ROM gentle cervical stretch laterally   Manual Traction manual traction mod pull 5 reps x 20-30 sec           Trigger Point Dry Needling - 05/12/16 0836    Consent Given? Yes   Education Handout Provided Yes   Muscles Treated Upper Body Upper trapezius;Levator scapulae;Rhomboids  Lt   Upper  Trapezius Response Twitch reponse elicited;Palpable increased muscle length   Levator Scapulae Response Twitch response elicited;Palpable increased muscle length   Rhomboids Response Twitch response elicited;Palpable increased muscle length              PT Education - 05/12/16 0818    Education provided Yes   Education Details HEP TDN   Person(s) Educated Patient   Methods Explanation;Demonstration;Tactile cues;Verbal cues;Handout   Comprehension Verbalized understanding;Returned demonstration;Verbal cues required;Tactile cues required             PT Long Term Goals - 05/12/16 0807    PT LONG TERM GOAL #1   Title Improve posture and alignment with patient to demonstrate upright posture through cervical and thoracic spine with improved position of scapulae along the thoracic wall   06/01/16   Time 6   Period Weeks   Status On-going   PT LONG TERM GOAL #2   Title Cervical ROM WNL's and painfree   06/01/16   Time 6   Period Weeks   Status On-going   PT LONG TERM GOAL #3   Title Patient reports that he can cough and sneeze without chest pain 06/01/16   Time 6   Period Weeks   Status Partially Met   PT LONG TERM GOAL #4   Title Independent in HEP 06/01/16   Time 6   Period Weeks   Status On-going   PT LONG TERM GOAL #5   Title Improve FOTO to </= 25% limitation  06/01/16   Time 6   Period Weeks   Status On-going               Plan - 05/12/16 0808    Clinical Impression Statement Continued improvement with treatment including manual work - especially passive stretching for the cervical spine. Improving pain levels and increasing functional activitiy. Progressing well toward stated goals of therapy.    Rehab Potential Good   PT Frequency 2x / week   PT Duration 6 weeks   PT Treatment/Interventions Patient/family education;ADLs/Self Care Home Management;Neuromuscular re-education;Manual techniques;Dry needling;Cryotherapy;Electrical Stimulation;Iontophoresis  91m/ml Dexamethasone;Moist Heat;Ultrasound;Traction;Therapeutic activities;Therapeutic exercise   PT Next Visit Plan postural correction/education; ROM; strengthening; maunal work; modalities as indicated consider TDN as indicated traps Lt and thoracic paraspinals   PT Home Exercise Plan HEP; myofacial release work      Patient will benefit from skilled therapeutic intervention in order to improve the following deficits and impairments:  Postural dysfunction, Improper body mechanics, Pain, Increased fascial restricitons, Increased muscle spasms, Decreased range of motion, Decreased strength, Decreased mobility, Decreased endurance, Decreased activity tolerance  Visit Diagnosis: Other symptoms and signs involving the musculoskeletal system  Abnormal posture  Myalgia  Problem List Patient Active Problem List   Diagnosis Date Noted  . Vitamin D deficiency 04/17/2016  . Fracture, sternum closed 04/12/2016  . Neck pain 04/12/2016  . HERPES LABIALIS 10/20/2008    Celyn Nilda Simmer PT, MPH  05/12/2016, 8:53 AM  [facility] [place], [place], [address] Phone: [phone]   Fax:  [phone]  Name: Allen Mcpherson MRN: [id number] Date of Birth: Sep 26, 1976

## 2016-05-17 ENCOUNTER — Encounter: Payer: Self-pay | Admitting: Rehabilitative and Restorative Service Providers"

## 2016-05-17 ENCOUNTER — Ambulatory Visit (INDEPENDENT_AMBULATORY_CARE_PROVIDER_SITE_OTHER): Payer: Managed Care, Other (non HMO) | Admitting: Rehabilitative and Restorative Service Providers"

## 2016-05-17 DIAGNOSIS — M791 Myalgia, unspecified site: Secondary | ICD-10-CM

## 2016-05-17 DIAGNOSIS — R293 Abnormal posture: Secondary | ICD-10-CM | POA: Diagnosis not present

## 2016-05-17 DIAGNOSIS — R29898 Other symptoms and signs involving the musculoskeletal system: Secondary | ICD-10-CM

## 2016-05-17 NOTE — Therapy (Addendum)
[place], [place], [address] Phone: [phone]   Fax:  [phone]  Physical Therapy Treatment  Patient Details  Name: Allen Mcpherson MRN: [id number] Date of Birth: 06/23/1976 Referring Provider: Dr. Georgina Snell  Encounter Date: 05/17/2016      PT End of Session - 05/17/16 0806    Visit Number 6   Number of Visits 12   Date for PT Re-Evaluation 06/01/16   PT Start Time 0804   PT Stop Time 0900   PT Time Calculation (min) 56 min   Activity Tolerance Patient tolerated treatment well      History reviewed. No pertinent past medical history.  Past Surgical History  Procedure Laterality Date  . Kidney surgery    . Hernia repair      There were no vitals filed for this visit.      Subjective Assessment - 05/17/16 0806    Subjective Improving - some soreness - he thinks because it rained. Did not care for the TDN. Working on his exercises at home.    Currently in Pain? Yes   Pain Score 2    Pain Location Shoulder   Pain Orientation Left   Pain Descriptors / Indicators Sore            [facility] PT Assessment - 05/17/16 0001    Assessment   Medical Diagnosis Fracture of sternum; neck pain; LBP    Referring Provider Dr. Georgina Snell   Onset Date/Surgical Date 03/25/16   Hand Dominance Right   Next MD Visit none secheduled    Prior Therapy none   Observation/Other Assessments   Focus on Therapeutic Outcomes (FOTO)  13% limitation    AROM   Cervical Flexion 52   Cervical Extension 52   Cervical - Right Side Bend 43   Cervical - Left Side Bend 41   Cervical - Right Rotation 70   Cervical - Left Rotation 70   Lumbar Flexion WFL   Lumbar Extension WFL   Lumbar - Right Side Bend WFL   Lumbar - Left Side Bend WFL   Lumbar - Right Rotation WFL   Lumbar - Left Rotation WFL   Strength   Overall Strength Comments 5/5   Flexibility   Hamstrings 75 deg bilat    Quadriceps WFL's   ITB WFL's   Piriformis WFL's   Palpation   Spinal mobility minimal tender and tight with CPA mobs C5/6/7 and L3/4/5    Palpation comment improving tenderness and tightness through ant/lat/post cervical musculature; upper traps; cervical and thoracic paraspinals; pecs                     [facility] Adult PT Treatment/Exercise - 05/17/16 0001    Shoulder Exercises: Prone   Retraction Both;10 reps  row   Shoulder Exercises: Standing   Extension Strengthening;Both;20 reps;Theraband   Theraband Level (Shoulder Extension) Level 3 (Green)   Row Strengthening;Both;20 reps;Theraband  added rpw at 45 deg x 20    Theraband Level (Shoulder Row) Level 3 (Green)   Retraction Both;20 reps;Theraband   Theraband Level (Shoulder Retraction) Level 2 (Red)  with noodle    Other Standing Exercises scap squeeze with noodle 10 sec x 10    Shoulder Exercises: ROM/Strengthening   UBE (Upper Arm Bike) L2 2 min alt fwd/back   Shoulder Exercises: Stretch   Other Shoulder Stretches 3way doorway stretch 30 sec x 3 each    Moist Heat Therapy   Number Minutes  Moist Heat 15 Minutes   Moist Heat Location Cervical;Lumbar Spine  mid back   Electrical Stimulation   Electrical Stimulation Location Lt lower cervical paraspinals and upper thoracic paraspinals   Electrical Stimulation Action IFC   Electrical Stimulation Parameters to tolerance   Electrical Stimulation Goals Pain;Tone   Manual Therapy   Manual therapy comments pt prone and supine   Soft tissue mobilization through the cervical and thoraic paraspinals and cervical musculature    Myofascial Release thoracic spine - mid scapular area    Passive ROM gentle cervical stretch laterally   Manual Traction manual traction mod pull 5 reps x 20-30 sec    Neck Exercises: Stretches   Other Neck Stretches scaleni stretches x 3 positions 30 sec x 3 reps                 PT Education - 05/17/16 0834    Education provided Yes   Education Details HEP     Person(s) Educated Patient   Methods Explanation;Demonstration;Tactile cues;Verbal cues;Handout   Comprehension Verbalized understanding;Returned demonstration;Verbal cues required;Tactile cues required             PT Long Term Goals - 05/17/16 0816    PT LONG TERM GOAL #1   Title Improve posture and alignment with patient to demonstrate upright posture through cervical and thoracic spine with improved position of scapulae along the thoracic wall   06/01/16   Time 6   Period Weeks   Status Achieved   PT LONG TERM GOAL #2   Title Cervical ROM WNL's and painfree   06/01/16   Time 6   Period Weeks   Status Achieved   PT LONG TERM GOAL #3   Title Patient reports that he can cough and sneeze without chest pain 06/01/16   Time 6   Period Weeks   Status Partially Met   PT LONG TERM GOAL #4   Title Independent in HEP 06/01/16   Time 6   Period Weeks   Status Achieved   PT LONG TERM GOAL #5   Title Improve FOTO to </= 25% limitation  06/01/16   Time 6   Period Weeks   Status Achieved               Plan - 05/17/16 1031    Clinical Impression Statement Progressing well. Patient feels he is ready to continue with his HEP independently. He has very little pain and good gains in ROM as well as decresed tightness to palpation. He has accomplished part of his rehab goals. Patient will call  within two weeks if he wants to return for PT.  We will leave the chart open for ~ 2 weeks before discharging care.    Rehab Potential Good   PT Frequency 2x / week   PT Duration 6 weeks   PT Treatment/Interventions Patient/family education;ADLs/Self Care Home Management;Neuromuscular re-education;Manual techniques;Dry needling;Cryotherapy;Electrical Stimulation;Iontophoresis 64m/ml Dexamethasone;Moist Heat;Ultrasound;Traction;Therapeutic activities;Therapeutic exercise   PT Next Visit Plan postural correction/education; ROM; strengthening; maunal work; modalities as indicated Patient will call to  schedule if he feels he needs further treatment.    PT Home Exercise Plan HEP; myofacial release work   COncologistwith Plan of Care Patient      Patient will benefit from skilled therapeutic intervention in order to improve the following deficits and impairments:  Postural dysfunction, Improper body mechanics, Pain, Increased fascial restricitons, Increased muscle spasms, Decreased range of motion, Decreased strength, Decreased mobility, Decreased endurance, Decreased activity tolerance  Visit Diagnosis: Other symptoms and signs involving the musculoskeletal system  Abnormal posture  Myalgia     Problem List Patient Active Problem List   Diagnosis Date Noted  . Vitamin D deficiency 04/17/2016  . Fracture, sternum closed 04/12/2016  . Neck pain 04/12/2016  . HERPES LABIALIS 10/20/2008    Idara Woodside Nilda Simmer PT, MPH  05/17/2016, 10:35 AM  [facility] [place], [place], [address] Phone: [phone]   Fax:  [phone]  Name: Allen Mcpherson MRN: [id number] Date of Birth: 17-Nov-1976    PHYSICAL THERAPY DISCHARGE SUMMARY  Visits from Start of Care: 6  Current functional level related to goals / functional outcomes: See progress note for discharge status   Remaining deficits: Shoulder continue with independentt HEP    Education / Equipment: HEP  Plan: Patient agrees to discharge.  Patient goals were partially met. Patient is being discharged due to not returning since the last visit.  ?????     Lorian Yaun P. Helene Kelp PT, MPH 08/03/16 12:54 PM

## 2016-05-17 NOTE — Patient Instructions (Signed)
Scalene Stretch, Sitting    Lying on back with head supported on thin pillow, one hand tucked under hip on side to be stretched, other hand over top of head. Gently pull head to side.  Hold _20-30__ seconds.  Three positions - head turned slightly to the right; middle and slightly to the left  Repeat ___ times per session. Do ___ sessions per day.

## 2017-07-24 ENCOUNTER — Ambulatory Visit (INDEPENDENT_AMBULATORY_CARE_PROVIDER_SITE_OTHER): Payer: Managed Care, Other (non HMO) | Admitting: Family Medicine

## 2017-07-24 ENCOUNTER — Encounter: Payer: Self-pay | Admitting: Family Medicine

## 2017-07-24 VITALS — BP 123/77 | HR 79 | Ht 71.0 in | Wt 184.0 lb

## 2017-07-24 DIAGNOSIS — Z0000 Encounter for general adult medical examination without abnormal findings: Secondary | ICD-10-CM | POA: Diagnosis not present

## 2017-07-24 DIAGNOSIS — E559 Vitamin D deficiency, unspecified: Secondary | ICD-10-CM

## 2017-07-24 DIAGNOSIS — Z23 Encounter for immunization: Secondary | ICD-10-CM

## 2017-07-24 DIAGNOSIS — R202 Paresthesia of skin: Secondary | ICD-10-CM | POA: Diagnosis not present

## 2017-07-24 LAB — LIPID PANEL W/REFLEX DIRECT LDL
CHOL/HDL RATIO: 4.8 ratio (ref <5.0)
Cholesterol: 196 mg/dL (ref <200)
HDL: 41 mg/dL (ref >40)
LDL-CHOLESTEROL: 132 mg/dL — AB
Non-HDL Cholesterol (Calc): 155 mg/dL — ABNORMAL HIGH (ref <130)
Triglycerides: 115 mg/dL (ref <150)

## 2017-07-24 LAB — CBC
HCT: 43.4 % (ref 38.5–50.0)
Hemoglobin: 15 g/dL (ref 13.2–17.1)
MCH: 31.8 pg (ref 27.0–33.0)
MCHC: 34.6 g/dL (ref 32.0–36.0)
MCV: 92.1 fL (ref 80.0–100.0)
MPV: 12.1 fL (ref 7.5–12.5)
PLATELETS: 244 10*3/uL (ref 140–400)
RBC: 4.71 MIL/uL (ref 4.20–5.80)
RDW: 12.9 % (ref 11.0–15.0)
WBC: 6.2 10*3/uL (ref 3.8–10.8)

## 2017-07-24 LAB — COMPLETE METABOLIC PANEL WITH GFR
ALT: 28 U/L (ref 9–46)
AST: 19 U/L (ref 10–40)
Albumin: 4.3 g/dL (ref 3.6–5.1)
Alkaline Phosphatase: 144 U/L — ABNORMAL HIGH (ref 40–115)
BUN: 13 mg/dL (ref 7–25)
CHLORIDE: 105 mmol/L (ref 98–110)
CO2: 25 mmol/L (ref 20–32)
Calcium: 9.3 mg/dL (ref 8.6–10.3)
Creat: 1.09 mg/dL (ref 0.60–1.35)
GFR, EST NON AFRICAN AMERICAN: 84 mL/min (ref >=60)
GFR, Est African American: >89 mL/min (ref >=60)
GLUCOSE: 92 mg/dL (ref 65–99)
POTASSIUM: 4.1 mmol/L (ref 3.5–5.3)
SODIUM: 140 mmol/L (ref 135–146)
Total Bilirubin: 0.5 mg/dL (ref 0.2–1.2)
Total Protein: 7.2 g/dL (ref 6.1–8.1)

## 2017-07-24 NOTE — Patient Instructions (Signed)
Thank you for coming in today. Get labs today.,  If numbness if not better in 1 month we will do a nerve study.  Send me a message in 1 month if not better.   Try to get some exercise most of the days in a week.    Recheck with me as needed.    Paresthesia Paresthesia is a burning or prickling feeling. This feeling can happen in any part of the body. It often happens in the hands, arms, legs, or feet. Usually, it is not painful. In most cases, the feeling goes away in a short time and is not a sign of a serious problem. Follow these instructions at home:  Avoid drinking alcohol.  Try massage or needle therapy (acupuncture) to help with your problems.  Keep all follow-up visits as told by your doctor. This is important. Contact a doctor if:  You keep on having episodes of paresthesia.  Your burning or prickling feeling gets worse when you walk.  You have pain or cramps.  You feel dizzy.  You have a rash. Get help right away if:  You feel weak.  You have trouble walking or moving.  You have problems speaking, understanding, or seeing.  You feel confused.  You cannot control when you pee (urinate) or poop (bowel movement).  You lose feeling (numbness) after an injury.  You pass out (faint). This information is not intended to replace advice given to you by your health care provider. Make sure you discuss any questions you have with your health care provider. Document Released: 11/10/2008 Document Revised: 05/05/2016 Document Reviewed: 11/24/2014 Elsevier Interactive Patient Education  [employer].

## 2017-07-24 NOTE — Progress Notes (Signed)
Allen Mcpherson is a 41 y.o. male who presents to [facility] today for a physical.   Paresthesia: Patient reports numbness and tingling in his feet for 1 month. He reports that he notices this sensation when waking up in the morning. Patient reports that the sensations also come and go throughout his day. He has tried changing shoes, but notes that this didn't help much. He has not had any balance issues or any changes in diet. Patient reports a history of surgery on his left big toe, and states this is where some of his numbness on that side is located. On the right side, he localizes the numbness to underneath the second and third toes.   Patient reports that he does not exercise regularly. He is going to work to increase his activity. Patient does not smoke. He reports drinking about 3-4 drinks/week. Patient reports a history of lipomas around his abdomen. He states that there are about 5 of them and have been present for 10 years. He has not noticed any recent changes in these areas.  Patient denies any fevers, chills, weight loss, changes in appetite, changes in bowel movements, or changes in urinary frequency.    No past medical history on file. Past Surgical History:  Procedure Laterality Date  . HERNIA REPAIR    . KIDNEY SURGERY     Social History  Substance Use Topics  . Smoking status: Former Research scientist (life sciences)  . Smokeless tobacco: Never Used  . Alcohol use Yes   family history includes Cancer in his father, maternal grandfather, maternal grandmother, paternal grandfather, and paternal grandmother.  ROS as above:  Medications: No current outpatient prescriptions on file.   No current facility-administered medications for this visit.    No Known Allergies  Health Maintenance Health Maintenance  Topic Date Due  . HIV Screening  07/18/1991  . INFLUENZA VACCINE   07/12/2017  . TETANUS/TDAP  07/24/2018 (Originally 07/18/1995)     Exam:  BP 123/77   Pulse 79   Ht 5\' 11"  (1.803 m)   Wt 184 lb (83.5 kg)   BMI 25.66 kg/m  Gen: Well NAD, pleasant, sitting comfortably in chair HEENT: EOMI,  MMM Lungs: Normal work of breathing. CTABL, equal respiratory expansion bilaterally Heart: RRR, normal S1 and S2, no MRG Abd: NABS, Soft. Nondistended, Nontender, lipoma palpated in RUQ Exts: Brisk capillary refill, warm and well perfused. No loss in sensation. Strength 5/5 with dorsiflexion and plantarflexion. Paresthesia is not reproduced with slump testing.   No results found for this or any previous visit (from the past 72 hour(s)). No results found.    Assessment and Plan: 41 y.o. male who presents for a physical. Patient reports intermittent paresthesias bilaterally in his feet. Patient will undergo lab testing to look for any reversible causes of these sensations. Patient will continue to monitor symptoms over the next month and contact the clinic if they continue. If they persist, it may be necessary to order an nerve conduction study to further evaluate for any neurologic abnormalities.   Patient will receive Tdap booster today in clinic.    Orders Placed This Encounter  Procedures  . CBC  . COMPLETE METABOLIC PANEL WITH GFR  . Lipid Panel w/reflex Direct LDL  . VITAMIN D 25 Hydroxy (Vit-D Deficiency, Fractures)  . Vitamin B12  . Methylmalonic acid, serum  . RPR  . HIV antibody   No orders of the defined types were placed  in this encounter.    Discussed warning signs or symptoms. Please see discharge instructions. Patient expresses understanding.

## 2017-07-25 LAB — HIV ANTIBODY (ROUTINE TESTING W REFLEX): HIV: NONREACTIVE

## 2017-07-25 LAB — VITAMIN D 25 HYDROXY (VIT D DEFICIENCY, FRACTURES): VIT D 25 HYDROXY: 27 ng/mL — AB (ref 30–100)

## 2017-07-25 LAB — VITAMIN B12: Vitamin B-12: 769 pg/mL (ref 200–1100)

## 2017-07-25 LAB — RPR

## 2017-07-27 LAB — METHYLMALONIC ACID, SERUM: Methylmalonic Acid, Quant: 142 nmol/L (ref 87–318)
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
## 2018-10-22 ENCOUNTER — Ambulatory Visit (INDEPENDENT_AMBULATORY_CARE_PROVIDER_SITE_OTHER): Payer: Managed Care, Other (non HMO) | Admitting: Family Medicine

## 2018-10-22 ENCOUNTER — Encounter: Payer: Self-pay | Admitting: Family Medicine

## 2018-10-22 VITALS — BP 128/73 | HR 79 | Ht 71.0 in | Wt 180.0 lb

## 2018-10-22 DIAGNOSIS — Z0000 Encounter for general adult medical examination without abnormal findings: Secondary | ICD-10-CM

## 2018-10-22 DIAGNOSIS — E559 Vitamin D deficiency, unspecified: Secondary | ICD-10-CM

## 2018-10-22 DIAGNOSIS — Z6825 Body mass index (BMI) 25.0-25.9, adult: Secondary | ICD-10-CM | POA: Diagnosis not present

## 2018-10-22 DIAGNOSIS — B001 Herpesviral vesicular dermatitis: Secondary | ICD-10-CM

## 2018-10-22 DIAGNOSIS — E782 Mixed hyperlipidemia: Secondary | ICD-10-CM

## 2018-10-22 MED ORDER — VALACYCLOVIR HCL 1 G PO TABS: ORAL_TABLET | ORAL | 4 refills | Status: DC

## 2018-10-22 NOTE — Patient Instructions (Signed)
Thank you for coming in today. Get labs today.  Recheck yearly if all is well.  Take valtrex 2 pills twice daily at onset of cold sores/fever blisters.

## 2018-10-22 NOTE — Progress Notes (Signed)
Allen Mcpherson is a 42 y.o. male who presents to [facility]: [facility] today for a physical.   At last years physical, Allen Mcpherson complained of numbness and tingling in his feet. Work-up for tingling returned normal, including HIV antibody, RPR, Vit B12, and methylmalonic acid. He reports that the numbness and tingling has resolved, and he thinks it was caused by the shoes he was wearing.   Today, Allen Mcpherson reports having fever blisters every couple months and would like a prescription for Valtrex. He said that they tingle and are very bothersome.   He has started taking Vit D, as his Vit D was low at last years appointment, and Vit B12. He said that his diet isn't great and consists of a lot of sugar. For exercise, he walks 30-40 min daily. He reports decreasing the amount of alcohol he drinks to less than weekly. He does not smoke.   ROS as above: Denies headaches, fevers, chills, cough, dyspnea, chest pain, N/V, abdominal pain, changes in bowel movements, and changes in urinary habits.   Exam:  BP 128/73   Pulse 79   Ht 5\' 11"  (1.803 m)   Wt 180 lb (81.6 kg)   BMI 25.10 kg/m  Wt Readings from Last 5 Encounters:  10/22/18 180 lb (81.6 kg)  07/24/17 184 lb (83.5 kg)  04/12/16 174 lb (78.9 kg)  03/31/16 175 lb (79.4 kg)    Gen: Well NAD HEENT: EOMI,  MMM Lungs: Normal work of breathing. CTABL, equal respiratory expansion bilaterally Heart: RRR, normal S1 and S2, no MRG. No carotid bruits. Abd: NABS, Soft. Nondistended, Nontender Exts: Brisk capillary refill, warm and well perfused.   Lab and Radiology Results Lipid Panel 07/2017: Cholesterol 196 Triglycerides 115 HDL 41 LDL 132  Depression screen [facility] 2/9 10/22/2018 07/24/2017  Decreased Interest 0 0  Down, Depressed, Hopeless 0 0  PHQ - 2 Score 0 0     Office Visit from 10/22/2018 in [place]  AUDIT-C Score  2      Assessment and Plan: 42 y.o. male who presents for a physical.   Doing well.  Continue to exercise and eat a careful diet.  Generally healthy lifestyle.  Cold sores: Allen Mcpherson reports that he gets bothersome fever blisters every month. Will prescribe Valtrex two tablets twice daily at onset of cold sores.  Lipid panel: Last years lipid panel was on the high end of normal. No statins needed at that time. Will recheck lipid panel today. Follow-up on lab results.  Yearly physical: Will follow-up on his other lab results (CBC and CMP). Plan for physical in a year.     Orders Placed This Encounter  Procedures  . CBC  . COMPLETE METABOLIC PANEL WITH GFR  . Lipid Panel w/reflex Direct LDL   Meds ordered this encounter  Medications  . valACYclovir (VALTREX) 1000 MG tablet    Sig: 2 tablets twice daily for 1 day at onset of cold sores.Use as needed    Dispense:  20 tablet    Refill:  4     Historical information moved to improve visibility of documentation.  Past Medical History:  Diagnosis Date  . Herpes labialis without complication 27/0/3500   Qualifier: Diagnosis of  By: Geoffry Paradise MD, Kristine Royal    Past Surgical History:  Procedure Laterality Date  . HERNIA REPAIR    . KIDNEY SURGERY     Social  History   Tobacco Use  . Smoking status: Former Research scientist (life sciences)  . Smokeless tobacco: Never Used  Substance Use Topics  . Alcohol use: Yes   family history includes Cancer in his father, maternal grandfather, maternal grandmother, paternal grandfather, and paternal grandmother.  Medications: Current Outpatient Medications  Medication Sig Dispense Refill  . Multiple Vitamin (MULTIVITAMIN) capsule Take by mouth.    . valACYclovir (VALTREX) 1000 MG tablet 2 tablets twice daily for 1 day at onset of cold sores.Use as needed 20 tablet 4  . vitamin B-12 (CYANOCOBALAMIN) 500 MCG tablet Take by mouth.    Marland Kitchen VITAMIN D, CHOLECALCIFEROL, PO Take  by mouth.     No current facility-administered medications for this visit.    No Known Allergies   Discussed warning signs or symptoms. Please see discharge instructions. Patient expresses understanding.   I personally was present and performed or re-performed the history, physical exam and medical decision-making activities of this service and have verified that the service and findings are accurately documented in the student's note. ___________________________________________ Lynne Leader M.D., ABFM., CAQSM. Primary Care and Sports Medicine Adjunct Instructor of [place] [employer]

## 2018-10-23 LAB — LIPID PANEL W/REFLEX DIRECT LDL
Cholesterol: 208 mg/dL — ABNORMAL HIGH (ref <200)
HDL: 41 mg/dL (ref >40)
LDL CHOLESTEROL (CALC): 139 mg/dL — AB
NON-HDL CHOLESTEROL (CALC): 167 mg/dL — AB (ref <130)
TRIGLYCERIDES: 150 mg/dL — AB (ref <150)
Total CHOL/HDL Ratio: 5.1 (calc) — ABNORMAL HIGH (ref <5.0)

## 2018-10-23 LAB — CBC
HCT: 43.4 % (ref 38.5–50.0)
HEMOGLOBIN: 15.2 g/dL (ref 13.2–17.1)
MCH: 32.3 pg (ref 27.0–33.0)
MCHC: 35 g/dL (ref 32.0–36.0)
MCV: 92.3 fL (ref 80.0–100.0)
MPV: 11.9 fL (ref 7.5–12.5)
Platelets: 242 10*3/uL (ref 140–400)
RBC: 4.7 10*6/uL (ref 4.20–5.80)
RDW: 12.6 % (ref 11.0–15.0)
WBC: 7.1 10*3/uL (ref 3.8–10.8)

## 2018-10-23 LAB — COMPLETE METABOLIC PANEL WITH GFR
AG Ratio: 1.6 (calc) (ref 1.0–2.5)
ALBUMIN MSPROF: 4.4 g/dL (ref 3.6–5.1)
ALT: 25 U/L (ref 9–46)
AST: 17 U/L (ref 10–40)
Alkaline phosphatase (APISO): 141 U/L — ABNORMAL HIGH (ref 40–115)
BUN: 14 mg/dL (ref 7–25)
CO2: 29 mmol/L (ref 20–32)
CREATININE: 0.98 mg/dL (ref 0.60–1.35)
Calcium: 9.6 mg/dL (ref 8.6–10.3)
Chloride: 103 mmol/L (ref 98–110)
GFR, EST AFRICAN AMERICAN: 110 mL/min/{1.73_m2} (ref >=60)
GFR, Est Non African American: 95 mL/min/{1.73_m2} (ref >=60)
GLUCOSE: 92 mg/dL (ref 65–99)
Globulin: 2.8 g/dL (calc) (ref 1.9–3.7)
POTASSIUM: 4.2 mmol/L (ref 3.5–5.3)
Sodium: 140 mmol/L (ref 135–146)
TOTAL PROTEIN: 7.2 g/dL (ref 6.1–8.1)
Total Bilirubin: 0.6 mg/dL (ref 0.2–1.2)

## 2020-10-21 ENCOUNTER — Other Ambulatory Visit: Payer: Self-pay

## 2020-10-21 ENCOUNTER — Encounter: Payer: Self-pay | Admitting: Family Medicine

## 2020-10-21 ENCOUNTER — Ambulatory Visit (INDEPENDENT_AMBULATORY_CARE_PROVIDER_SITE_OTHER): Payer: BC Managed Care – PPO | Admitting: Family Medicine

## 2020-10-21 VITALS — BP 125/72 | HR 90 | Temp 98.2°F | Ht 69.29 in | Wt 175.7 lb

## 2020-10-21 DIAGNOSIS — Z0000 Encounter for general adult medical examination without abnormal findings: Secondary | ICD-10-CM | POA: Insufficient documentation

## 2020-10-21 DIAGNOSIS — E782 Mixed hyperlipidemia: Secondary | ICD-10-CM | POA: Diagnosis not present

## 2020-10-21 DIAGNOSIS — E559 Vitamin D deficiency, unspecified: Secondary | ICD-10-CM

## 2020-10-21 LAB — CBC
HCT: 44 % (ref 38.5–50.0)
Hemoglobin: 15.4 g/dL (ref 13.2–17.1)
MCH: 31.8 pg (ref 27.0–33.0)
MCHC: 35 g/dL (ref 32.0–36.0)
MCV: 90.7 fL (ref 80.0–100.0)
MPV: 12.1 fL (ref 7.5–12.5)
Platelets: 265 10*3/uL (ref 140–400)
RBC: 4.85 10*6/uL (ref 4.20–5.80)
RDW: 11.8 % (ref 11.0–15.0)
WBC: 6.8 10*3/uL (ref 3.8–10.8)

## 2020-10-21 LAB — COMPLETE METABOLIC PANEL WITH GFR
AG Ratio: 1.7 (calc) (ref 1.0–2.5)
ALT: 18 U/L (ref 9–46)
AST: 14 U/L (ref 10–40)
Albumin: 4.6 g/dL (ref 3.6–5.1)
Alkaline phosphatase (APISO): 133 U/L — ABNORMAL HIGH (ref 36–130)
BUN: 13 mg/dL (ref 7–25)
CO2: 29 mmol/L (ref 20–32)
Calcium: 9.9 mg/dL (ref 8.6–10.3)
Chloride: 103 mmol/L (ref 98–110)
Creat: 0.98 mg/dL (ref 0.60–1.35)
GFR, Est African American: 108 mL/min/{1.73_m2} (ref >=60)
GFR, Est Non African American: 93 mL/min/{1.73_m2} (ref >=60)
Globulin: 2.7 g/dL (calc) (ref 1.9–3.7)
Glucose, Bld: 96 mg/dL (ref 65–99)
Potassium: 4.3 mmol/L (ref 3.5–5.3)
Sodium: 139 mmol/L (ref 135–146)
Total Bilirubin: 0.7 mg/dL (ref 0.2–1.2)
Total Protein: 7.3 g/dL (ref 6.1–8.1)

## 2020-10-21 LAB — LIPID PANEL
Cholesterol: 202 mg/dL — ABNORMAL HIGH (ref <200)
HDL: 37 mg/dL — ABNORMAL LOW (ref >=40)
LDL Cholesterol (Calc): 140 mg/dL (calc) — ABNORMAL HIGH
Non-HDL Cholesterol (Calc): 165 mg/dL (calc) — ABNORMAL HIGH (ref <130)
Total CHOL/HDL Ratio: 5.5 (calc) — ABNORMAL HIGH (ref <5.0)
Triglycerides: 128 mg/dL (ref <150)

## 2020-10-21 LAB — VITAMIN D 25 HYDROXY (VIT D DEFICIENCY, FRACTURES): Vit D, 25-Hydroxy: 25 ng/mL — ABNORMAL LOW (ref 30–100)

## 2020-10-21 MED ORDER — VALACYCLOVIR HCL 1 G PO TABS: ORAL_TABLET | ORAL | 4 refills | Status: DC

## 2020-10-21 NOTE — Progress Notes (Signed)
Allen Mcpherson - 44 y.o. male MRN [id number]  Date of birth: Apr 09, 1976  Subjective Chief Complaint  Patient presents with  . Annual Exam    HPI Allen Mcpherson is a 44 y.o. male here today for annual exam.  He has history of mild dyslipidemia and HSV.  He takes valtrex as needed for cold sores.  He has no new concerns today.   He is a non-smoker.   He has a serving of EtOH occasionally.   He exercises occasionally and feels that his diet is pretty good.    He had COVID vaccine.  He declines flu vaccine.   Review of Systems  Constitutional: Negative for chills, fever, malaise/fatigue and weight loss.  HENT: Negative for congestion, ear pain and sore throat.   Eyes: Negative for blurred vision, double vision and pain.  Respiratory: Negative for cough and shortness of breath.   Cardiovascular: Negative for chest pain and palpitations.  Gastrointestinal: Negative for abdominal pain, blood in stool, constipation, heartburn and nausea.  Genitourinary: Negative for dysuria and urgency.  Musculoskeletal: Negative for joint pain and myalgias.  Neurological: Negative for dizziness and headaches.  Endo/Heme/Allergies: Does not bruise/bleed easily.  Psychiatric/Behavioral: Negative for depression. The patient is not nervous/anxious and does not have insomnia.      No Known Allergies  Past Medical History:  Diagnosis Date  . Herpes labialis without complication 07/12/4480   Qualifier: Diagnosis of  By: Geoffry Paradise MD, Kristine Royal     Past Surgical History:  Procedure Laterality Date  . HERNIA REPAIR    . KIDNEY SURGERY      Social History   Socioeconomic History  . Marital status: Married    Spouse name: Not on file  . Number of children: Not on file  . Years of education: Not on file  . Highest education level: Not on file  Occupational History  . Not on file  Tobacco Use  . Smoking status: Former Research scientist (life sciences)  . Smokeless tobacco: Never Used  Substance and Sexual Activity  .  Alcohol use: Yes  . Drug use: Not on file  . Sexual activity: Not on file  Other Topics Concern  . Not on file  Social History Narrative  . Not on file   Social Determinants of Health   Financial Resource Strain:   . Difficulty of Paying Living Expenses: Not on file  Food Insecurity:   . Worried About Charity fundraiser in the Last Year: Not on file  . Ran Out of Food in the Last Year: Not on file  Transportation Needs:   . Lack of Transportation (Medical): Not on file  . Lack of Transportation (Non-Medical): Not on file  Physical Activity:   . Days of Exercise per Week: Not on file  . Minutes of Exercise per Session: Not on file  Stress:   . Feeling of Stress : Not on file  Social Connections:   . Frequency of Communication with Friends and Family: Not on file  . Frequency of Social Gatherings with Friends and Family: Not on file  . Attends Religious Services: Not on file  . Active Member of Clubs or Organizations: Not on file  . Attends Archivist Meetings: Not on file  . Marital Status: Not on file    Family History  Problem Relation Age of Onset  . Cancer Father   . Cancer Maternal Grandmother   . Cancer Maternal Grandfather   . Cancer Paternal Grandmother   . Cancer Paternal Grandfather  Health Maintenance  Topic Date Due  . Hepatitis C Screening  Never done  . COVID-19 Vaccine (1) Never done  . INFLUENZA VACCINE  03/11/2021 (Originally 07/12/2020)  . TETANUS/TDAP  07/25/2027  . HIV Screening  Completed     ----------------------------------------------------------------------------------------------------------------------------------------------------------------------------------------------------------------- Physical Exam BP 125/72 (BP Location: Left Arm, Patient Position: Sitting, Cuff Size: Normal)   Pulse 90   Temp 98.2 F (36.8 C)   Ht 5' 9.29" (1.76 m)   Wt 175 lb 11.2 oz (79.7 kg)   SpO2 97%   BMI 25.73 kg/m   Physical  Exam Constitutional:      General: He is not in acute distress.    Appearance: Normal appearance.  HENT:     Head: Normocephalic and atraumatic.     Right Ear: External ear normal.     Left Ear: External ear normal.  Eyes:     General: No scleral icterus. Neck:     Thyroid: No thyromegaly.  Cardiovascular:     Rate and Rhythm: Normal rate and regular rhythm.     Heart sounds: Normal heart sounds.  Pulmonary:     Effort: Pulmonary effort is normal.     Breath sounds: Normal breath sounds.  Abdominal:     General: Bowel sounds are normal. There is no distension.     Palpations: Abdomen is soft.     Tenderness: There is no abdominal tenderness. There is no guarding.  Musculoskeletal:     Cervical back: Normal range of motion.  Lymphadenopathy:     Cervical: No cervical adenopathy.  Skin:    General: Skin is warm and dry.     Findings: No rash.  Neurological:     Mental Status: He is alert and oriented to person, place, and time.     Cranial Nerves: No cranial nerve deficit.     Motor: No abnormal muscle tone.  Psychiatric:        Mood and Affect: Mood normal.        Behavior: Behavior normal.     ------------------------------------------------------------------------------------------------------------------------------------------------------------------------------------------------------------------- Assessment and Plan  Well adult exam Well adult Orders Placed This Encounter  Procedures  . COMPLETE METABOLIC PANEL WITH GFR  . CBC  . Lipid Profile  . Vitamin D (25 hydroxy)  Screening: UTD Immunizations:  COVID completed, declines flu vaccine.  Anticipatory guidance/Risk factor reduction:  Update labs today.  Recommend regular exercise and healthy diet.  Additional recommendations per AVS.    Meds ordered this encounter  Medications  . valACYclovir (VALTREX) 1000 MG tablet    Sig: 2 tablets twice daily for 1 day at onset of cold sores.Use as needed     Dispense:  20 tablet    Refill:  4    No follow-ups on file.    This visit occurred during the SARS-CoV-2 public health emergency.  Safety protocols were in place, including screening questions prior to the visit, additional usage of staff PPE, and extensive cleaning of exam room while observing appropriate contact time as indicated for disinfecting solutions.

## 2020-10-21 NOTE — Assessment & Plan Note (Signed)
Well adult Orders Placed This Encounter  Procedures  . COMPLETE METABOLIC PANEL WITH GFR  . CBC  . Lipid Profile  . Vitamin D (25 hydroxy)  Screening: UTD Immunizations:  COVID completed, declines flu vaccine.  Anticipatory guidance/Risk factor reduction:  Update labs today.  Recommend regular exercise and healthy diet.  Additional recommendations per AVS.

## 2020-10-21 NOTE — Patient Instructions (Signed)
Preventive Care 41-44 Years Old, Male Preventive care refers to lifestyle choices and visits with your health care provider that can promote health and wellness. This includes:  A yearly physical exam. This is also called an annual well check.  Regular dental and eye exams.  Immunizations.  Screening for certain conditions.  Healthy lifestyle choices, such as eating a healthy diet, getting regular exercise, not using drugs or products that contain nicotine and tobacco, and limiting alcohol use. What can I expect for my preventive care visit? Physical exam Your health care provider will check:  Height and weight. These may be used to calculate body mass index (BMI), which is a measurement that tells if you are at a healthy weight.  Heart rate and blood pressure.  Your skin for abnormal spots. Counseling Your health care provider may ask you questions about:  Alcohol, tobacco, and drug use.  Emotional well-being.  Home and relationship well-being.  Sexual activity.  Eating habits.  Work and work Statistician. What immunizations do I need?  Influenza (flu) vaccine  This is recommended every year. Tetanus, diphtheria, and pertussis (Tdap) vaccine  You may need a Td booster every 10 years. Varicella (chickenpox) vaccine  You may need this vaccine if you have not already been vaccinated. Zoster (shingles) vaccine  You may need this after age 64. Measles, mumps, and rubella (MMR) vaccine  You may need at least one dose of MMR if you were born in 1957 or later. You may also need a second dose. Pneumococcal conjugate (PCV13) vaccine  You may need this if you have certain conditions and were not previously vaccinated. Pneumococcal polysaccharide (PPSV23) vaccine  You may need one or two doses if you smoke cigarettes or if you have certain conditions. Meningococcal conjugate (MenACWY) vaccine  You may need this if you have certain conditions. Hepatitis A  vaccine  You may need this if you have certain conditions or if you travel or work in places where you may be exposed to hepatitis A. Hepatitis B vaccine  You may need this if you have certain conditions or if you travel or work in places where you may be exposed to hepatitis B. Haemophilus influenzae type b (Hib) vaccine  You may need this if you have certain risk factors. Human papillomavirus (HPV) vaccine  If recommended by your health care provider, you may need three doses over 6 months. You may receive vaccines as individual doses or as more than one vaccine together in one shot (combination vaccines). Talk with your health care provider about the risks and benefits of combination vaccines. What tests do I need? Blood tests  Lipid and cholesterol levels. These may be checked every 5 years, or more frequently if you are over 60 years old.  Hepatitis C test.  Hepatitis B test. Screening  Lung cancer screening. You may have this screening every year starting at age 43 if you have a 30-pack-year history of smoking and currently smoke or have quit within the past 15 years.  Prostate cancer screening. Recommendations will vary depending on your family history and other risks.  Colorectal cancer screening. All adults should have this screening starting at age 72 and continuing until age 2. Your health care provider may recommend screening at age 14 if you are at increased risk. You will have tests every 1-10 years, depending on your results and the type of screening test.  Diabetes screening. This is done by checking your blood sugar (glucose) after you have not eaten  for a while (fasting). You may have this done every 1-3 years.  Sexually transmitted disease (STD) testing. Follow these instructions at home: Eating and drinking  Eat a diet that includes fresh fruits and vegetables, whole grains, lean protein, and low-fat dairy products.  Take vitamin and mineral supplements as  recommended by your health care provider.  Do not drink alcohol if your health care provider tells you not to drink.  If you drink alcohol: ? Limit how much you have to 0-2 drinks a day. ? Be aware of how much alcohol is in your drink. In the [place] one drink equals one 12 oz bottle of beer (355 mL), one 5 oz glass of wine (148 mL), or one 1 oz glass of hard liquor (44 mL). Lifestyle  Take daily care of your teeth and gums.  Stay active. Exercise for at least 30 minutes on 5 or more days each week.  Do not use any products that contain nicotine or tobacco, such as cigarettes, e-cigarettes, and chewing tobacco. If you need help quitting, ask your health care provider.  If you are sexually active, practice safe sex. Use a condom or other form of protection to prevent STIs (sexually transmitted infections).  Talk with your health care provider about taking a low-dose aspirin every day starting at age 53. What's next?  Go to your health care provider once a year for a well check visit.  Ask your health care provider how often you should have your eyes and teeth checked.  Stay up to date on all vaccines. This information is not intended to replace advice given to you by your health care provider. Make sure you discuss any questions you have with your health care provider. Document Revised: 11/22/2018 Document Reviewed: 11/22/2018 Elsevier Patient Education  2020 [employer].

## 2021-11-04 ENCOUNTER — Ambulatory Visit: Payer: Self-pay

## 2021-11-10 ENCOUNTER — Encounter: Payer: Self-pay | Admitting: Family Medicine

## 2021-11-10 ENCOUNTER — Other Ambulatory Visit: Payer: Self-pay

## 2021-11-10 ENCOUNTER — Ambulatory Visit (INDEPENDENT_AMBULATORY_CARE_PROVIDER_SITE_OTHER): Payer: BC Managed Care – PPO | Admitting: Family Medicine

## 2021-11-10 VITALS — BP 125/80 | HR 85 | Temp 97.6°F | Ht 70.0 in | Wt 180.0 lb

## 2021-11-10 DIAGNOSIS — Z1211 Encounter for screening for malignant neoplasm of colon: Secondary | ICD-10-CM

## 2021-11-10 DIAGNOSIS — E782 Mixed hyperlipidemia: Secondary | ICD-10-CM | POA: Diagnosis not present

## 2021-11-10 DIAGNOSIS — Z0000 Encounter for general adult medical examination without abnormal findings: Secondary | ICD-10-CM

## 2021-11-10 DIAGNOSIS — E559 Vitamin D deficiency, unspecified: Secondary | ICD-10-CM

## 2021-11-10 MED ORDER — VALACYCLOVIR HCL 1 G PO TABS: ORAL_TABLET | ORAL | 4 refills | Status: DC

## 2021-11-10 NOTE — Progress Notes (Signed)
Allen Mcpherson - 45 y.o. male MRN [id number]  Date of birth: February 13, 1976  Subjective Chief Complaint  Patient presents with   Annual Exam    HPI [name] is a 45 year old male here today for annual exam.  He reports he is doing well at this time and denies any changes to his health history since last visit.  He is due for colon cancer screening and would like to have colonoscopy scheduled.  He would like to have updated labs completed today.  He does try to stay fairly active.  Feels like his diet is pretty good.  He is a non-smoker.  He consumes alcohol occasionally.  Review of Systems  Constitutional:  Negative for chills, fever, malaise/fatigue and weight loss.  HENT:  Negative for congestion, ear pain and sore throat.   Eyes:  Negative for blurred vision, double vision and pain.  Respiratory:  Negative for cough and shortness of breath.   Cardiovascular:  Negative for chest pain and palpitations.  Gastrointestinal:  Negative for abdominal pain, blood in stool, constipation, heartburn and nausea.  Genitourinary:  Negative for dysuria and urgency.  Musculoskeletal:  Negative for joint pain and myalgias.  Neurological:  Negative for dizziness and headaches.  Endo/Heme/Allergies:  Does not bruise/bleed easily.  Psychiatric/Behavioral:  Negative for depression. The patient is not nervous/anxious and does not have insomnia.    No Known Allergies  Past Medical History:  Diagnosis Date   Herpes labialis without complication 86/06/6194   Qualifier: Diagnosis of  By: Geoffry Paradise MD, Kristine Royal     Past Surgical History:  Procedure Laterality Date   HERNIA REPAIR     KIDNEY SURGERY      Social History   Socioeconomic History   Marital status: Married    Spouse name: Not on file   Number of children: Not on file   Years of education: Not on file   Highest education level: Not on file  Occupational History   Not on file  Tobacco Use   Smoking status: Former   Smokeless tobacco:  Never  Substance and Sexual Activity   Alcohol use: Yes   Drug use: Not on file   Sexual activity: Not on file  Other Topics Concern   Not on file  Social History Narrative   Not on file   Social Determinants of Health   Financial Resource Strain: Not on file  Food Insecurity: Not on file  Transportation Needs: Not on file  Physical Activity: Not on file  Stress: Not on file  Social Connections: Not on file    Family History  Problem Relation Age of Onset   Cancer Father    Cancer Maternal Grandmother    Cancer Maternal Grandfather    Cancer Paternal Grandmother    Cancer Paternal Grandfather     Health Maintenance  Topic Date Due   Hepatitis C Screening  Never done   COVID-19 Vaccine (3 - Booster for Moderna series) 07/07/2020   COLONOSCOPY (Pts 45-4yrs Insurance coverage will need to be confirmed)  Never done   INFLUENZA VACCINE  03/11/2022 (Originally 07/12/2021)   TETANUS/TDAP  07/25/2027   HIV Screening  Completed   Pneumococcal Vaccine 93-62 Years old  Aged Out   HPV VACCINES  Aged Out     ----------------------------------------------------------------------------------------------------------------------------------------------------------------------------------------------------------------- Physical Exam BP 125/80 (BP Location: Left Arm, Patient Position: Sitting, Cuff Size: Normal)   Pulse 85   Temp 97.6 F (36.4 C)   Ht 5\' 10"  (1.778 m)   Wt 180 lb (81.6  kg)   SpO2 97%   BMI 25.83 kg/m   Physical Exam Constitutional:      General: He is not in acute distress. HENT:     Head: Normocephalic and atraumatic.     Right Ear: Tympanic membrane and external ear normal.     Left Ear: Tympanic membrane and external ear normal.  Eyes:     General: No scleral icterus. Neck:     Thyroid: No thyromegaly.  Cardiovascular:     Rate and Rhythm: Normal rate and regular rhythm.     Heart sounds: Normal heart sounds.  Pulmonary:     Effort: Pulmonary  effort is normal.     Breath sounds: Normal breath sounds.  Abdominal:     General: Bowel sounds are normal. There is no distension.     Palpations: Abdomen is soft.     Tenderness: There is no abdominal tenderness. There is no guarding.  Musculoskeletal:     Cervical back: Normal range of motion.  Lymphadenopathy:     Cervical: No cervical adenopathy.  Skin:    General: Skin is warm and dry.     Findings: No rash.  Neurological:     Mental Status: He is alert and oriented to person, place, and time.     Cranial Nerves: No cranial nerve deficit.     Motor: No abnormal muscle tone.  Psychiatric:        Mood and Affect: Mood normal.        Behavior: Behavior normal.    ------------------------------------------------------------------------------------------------------------------------------------------------------------------------------------------------------------------- Assessment and Plan  Well adult exam Well adult Orders Placed This Encounter  Procedures   COMPLETE METABOLIC PANEL WITH GFR   CBC with Differential   Lipid Panel w/reflex Direct LDL   Vitamin D (25 hydroxy)   Ambulatory referral to Gastroenterology    Referral Priority:   Routine    Referral Type:   Consultation    Referral Reason:   Specialty Services Required    Number of Visits Requested:   1  Screenings: Referral for colonoscopy Immunizations: Declines flu vaccine Anticipatory guidance/risk factor reduction: Recommendations per AVS   Meds ordered this encounter  Medications   valACYclovir (VALTREX) 1000 MG tablet    Sig: 2 tablets twice daily for 1 day at onset of cold sores.Use as needed    Dispense:  20 tablet    Refill:  4    No follow-ups on file.    This visit occurred during the SARS-CoV-2 public health emergency.  Safety protocols were in place, including screening questions prior to the visit, additional usage of staff PPE, and extensive cleaning of exam room while observing  appropriate contact time as indicated for disinfecting solutions.

## 2021-11-10 NOTE — Assessment & Plan Note (Signed)
Well adult Orders Placed This Encounter  Procedures  . COMPLETE METABOLIC PANEL WITH GFR  . CBC with Differential  . Lipid Panel w/reflex Direct LDL  . Vitamin D (25 hydroxy)  . Ambulatory referral to Gastroenterology    Referral Priority:   Routine    Referral Type:   Consultation    Referral Reason:   Specialty Services Required    Number of Visits Requested:   1  Screenings: Referral for colonoscopy Immunizations: Declines flu vaccine Anticipatory guidance/risk factor reduction: Recommendations per AVS

## 2021-11-10 NOTE — Patient Instructions (Signed)

## 2021-11-11 LAB — COMPLETE METABOLIC PANEL WITH GFR
AG Ratio: 1.6 (calc) (ref 1.0–2.5)
ALT: 27 U/L (ref 9–46)
AST: 19 U/L (ref 10–40)
Albumin: 4.2 g/dL (ref 3.6–5.1)
Alkaline phosphatase (APISO): 123 U/L (ref 36–130)
BUN: 17 mg/dL (ref 7–25)
CO2: 26 mmol/L (ref 20–32)
Calcium: 9.4 mg/dL (ref 8.6–10.3)
Chloride: 105 mmol/L (ref 98–110)
Creat: 1.06 mg/dL (ref 0.60–1.29)
Globulin: 2.7 g/dL (calc) (ref 1.9–3.7)
Glucose, Bld: 94 mg/dL (ref 65–99)
Potassium: 3.9 mmol/L (ref 3.5–5.3)
Sodium: 142 mmol/L (ref 135–146)
Total Bilirubin: 0.7 mg/dL (ref 0.2–1.2)
Total Protein: 6.9 g/dL (ref 6.1–8.1)
eGFR: 88 mL/min/{1.73_m2} (ref >=60)

## 2021-11-11 LAB — CBC WITH DIFFERENTIAL/PLATELET
Absolute Monocytes: 799 cells/uL (ref 200–950)
Basophils Absolute: 30 cells/uL (ref 0–200)
Basophils Relative: 0.4 %
Eosinophils Absolute: 207 cells/uL (ref 15–500)
Eosinophils Relative: 2.8 %
HCT: 43.3 % (ref 38.5–50.0)
Hemoglobin: 15.2 g/dL (ref 13.2–17.1)
Lymphs Abs: 2523 cells/uL (ref 850–3900)
MCH: 31.9 pg (ref 27.0–33.0)
MCHC: 35.1 g/dL (ref 32.0–36.0)
MCV: 90.8 fL (ref 80.0–100.0)
MPV: 12 fL (ref 7.5–12.5)
Monocytes Relative: 10.8 %
Neutro Abs: 3841 cells/uL (ref 1500–7800)
Neutrophils Relative %: 51.9 %
Platelets: 235 10*3/uL (ref 140–400)
RBC: 4.77 10*6/uL (ref 4.20–5.80)
RDW: 11.9 % (ref 11.0–15.0)
Total Lymphocyte: 34.1 %
WBC: 7.4 10*3/uL (ref 3.8–10.8)

## 2021-11-11 LAB — LIPID PANEL W/REFLEX DIRECT LDL
Cholesterol: 201 mg/dL — ABNORMAL HIGH (ref <200)
HDL: 38 mg/dL — ABNORMAL LOW (ref >=40)
LDL Cholesterol (Calc): 140 mg/dL (calc) — ABNORMAL HIGH
Non-HDL Cholesterol (Calc): 163 mg/dL (calc) — ABNORMAL HIGH (ref <130)
Total CHOL/HDL Ratio: 5.3 (calc) — ABNORMAL HIGH (ref <5.0)
Triglycerides: 116 mg/dL (ref <150)

## 2021-11-11 LAB — VITAMIN D 25 HYDROXY (VIT D DEFICIENCY, FRACTURES): Vit D, 25-Hydroxy: 30 ng/mL (ref 30–100)

## 2021-11-24 ENCOUNTER — Encounter: Payer: Self-pay | Admitting: Gastroenterology

## 2021-11-24 ENCOUNTER — Encounter: Payer: Self-pay | Admitting: Family Medicine

## 2021-12-29 ENCOUNTER — Ambulatory Visit (AMBULATORY_SURGERY_CENTER): Payer: BC Managed Care – PPO

## 2021-12-29 ENCOUNTER — Other Ambulatory Visit: Payer: Self-pay

## 2021-12-29 VITALS — Ht 70.0 in | Wt 180.0 lb

## 2021-12-29 DIAGNOSIS — Z1211 Encounter for screening for malignant neoplasm of colon: Secondary | ICD-10-CM

## 2021-12-29 MED ORDER — NA SULFATE-K SULFATE-MG SULF 17.5-3.13-1.6 GM/177ML PO SOLN
1.0000 | Freq: Once | ORAL | 0 refills | Status: AC
Start: 2021-12-29 — End: 2021-12-29

## 2021-12-29 NOTE — Progress Notes (Signed)
No egg or soy allergy known to patient  No issues known to pt with past sedation with any surgeries or procedures Patient denies ever being told they had issues or difficulty with intubation  No FH of Malignant Hyperthermia Pt is not on diet pills Pt is not on home 02  Pt is not on blood thinners  Pt denies issues with constipation; No A fib or A flutter Pt is fully vaccinated for Covid x 2; NO PA's for preps discussed with pt in PV today  Discussed with pt there will be an out-of-pocket cost for prep and that varies from $0 to 70 + dollars - pt verbalized understanding  Due to the COVID-19 pandemic we are asking patients to follow certain guidelines in PV and the [place]   Pt aware of COVID protocols and [facility] guidelines  PV completed over the phone. Pt verified name, DOB, address and insurance during PV today.  Pt mailed instruction packet with copy of consent form to read and not return, and instructions.  Pt encouraged to call with questions or issues.  If pt has My chart, procedure instructions sent via My Chart

## 2022-01-03 ENCOUNTER — Encounter: Payer: Self-pay | Admitting: Gastroenterology

## 2022-01-03 DIAGNOSIS — Z1211 Encounter for screening for malignant neoplasm of colon: Secondary | ICD-10-CM

## 2022-01-03 MED ORDER — NA SULFATE-K SULFATE-MG SULF 17.5-3.13-1.6 GM/177ML PO SOLN: 1.0000 | ORAL | 0 refills | Status: DC

## 2022-01-07 ENCOUNTER — Encounter: Payer: Self-pay | Admitting: Gastroenterology

## 2022-01-12 ENCOUNTER — Encounter: Payer: Self-pay | Admitting: Gastroenterology

## 2022-01-12 ENCOUNTER — Ambulatory Visit (AMBULATORY_SURGERY_CENTER): Payer: BC Managed Care – PPO | Admitting: Gastroenterology

## 2022-01-12 ENCOUNTER — Other Ambulatory Visit: Payer: Self-pay

## 2022-01-12 VITALS — BP 129/81 | HR 82 | Temp 98.6°F | Resp 23 | Ht 70.0 in | Wt 180.0 lb

## 2022-01-12 DIAGNOSIS — D123 Benign neoplasm of transverse colon: Secondary | ICD-10-CM

## 2022-01-12 DIAGNOSIS — D124 Benign neoplasm of descending colon: Secondary | ICD-10-CM

## 2022-01-12 DIAGNOSIS — Z1211 Encounter for screening for malignant neoplasm of colon: Secondary | ICD-10-CM

## 2022-01-12 MED ORDER — SODIUM CHLORIDE 0.9 % IV SOLN: 500.0000 mL | Freq: Once | INTRAVENOUS | Status: DC

## 2022-01-12 NOTE — Patient Instructions (Signed)
Handout on polyps ,diverticulosis,& hemorrhoids given to you today  Await pathology results on polyps removed   Follow high fiber diet - handout given    YOU HAD AN ENDOSCOPIC PROCEDURE TODAY AT [facility]:   Refer to the procedure report that was given to you for any specific questions about what was found during the examination.  If the procedure report does not answer your questions, please call your gastroenterologist to clarify.  If you requested that your care partner not be given the details of your procedure findings, then the procedure report has been included in a sealed envelope for you to review at your convenience later.  YOU SHOULD EXPECT: Some feelings of bloating in the abdomen. Passage of more gas than usual.  Walking can help get rid of the air that was put into your GI tract during the procedure and reduce the bloating. If you had a lower endoscopy (such as a colonoscopy or flexible sigmoidoscopy) you may notice spotting of blood in your stool or on the toilet paper. If you underwent a bowel prep for your procedure, you may not have a normal bowel movement for a few days.  Please Note:  You might notice some irritation and congestion in your nose or some drainage.  This is from the oxygen used during your procedure.  There is no need for concern and it should clear up in a day or so.  SYMPTOMS TO REPORT IMMEDIATELY:  Following lower endoscopy (colonoscopy or flexible sigmoidoscopy):  Excessive amounts of blood in the stool  Significant tenderness or worsening of abdominal pains  Swelling of the abdomen that is new, acute  Fever of 100F or higher  For urgent or emergent issues, a gastroenterologist can be reached at any hour by calling [phone]. Do not use MyChart messaging for urgent concerns.    DIET:  We do recommend a small meal at first, but then you may proceed to your regular diet.  Drink plenty of fluids but you should avoid alcoholic  beverages for 24 hours.  ACTIVITY:  You should plan to take it easy for the rest of today and you should NOT DRIVE or use heavy machinery until tomorrow (because of the sedation medicines used during the test).    FOLLOW UP: Our staff will call the number listed on your records 48-72 hours following your procedure to check on you and address any questions or concerns that you may have regarding the information given to you following your procedure. If we do not reach you, we will leave a message.  We will attempt to reach you two times.  During this call, we will ask if you have developed any symptoms of COVID 19. If you develop any symptoms (ie: fever, flu-like symptoms, shortness of breath, cough etc.) before then, please call [phone].  If you test positive for Covid 19 in the 2 weeks post procedure, please call and report this information to [place].    If any biopsies were taken you will be contacted by phone or by letter within the next 1-3 weeks.  Please call us at [phone] if you have not heard about the biopsies in 3 weeks.    SIGNATURES/CONFIDENTIALITY: You and/or your care partner have signed paperwork which will be entered into your electronic medical record.  These signatures attest to the fact that that the information above on your After Visit Summary has been reviewed and is understood.  Full responsibility of the confidentiality of  this discharge information lies with you and/or your care-partner.

## 2022-01-12 NOTE — Progress Notes (Signed)
PT taken to PACU. Monitors in place. VSS. Report given to RN. 

## 2022-01-12 NOTE — Op Note (Signed)
[place] [facility] Patient Name: Giovany Cosby Procedure Date: 01/12/2022 2:40 PM MRN: [id number] Endoscopist: Jackquline Denmark , MD Age: 46 Referring MD:  Date of Birth: [birth year] Gender: Male Account #: [id number] Procedure:                Colonoscopy Indications:              Screening for colorectal malignant neoplasm Medicines:                Monitored Anesthesia Care Procedure:                Pre-Anesthesia Assessment:                           - Prior to the procedure, a History and Physical                            was performed, and patient medications and                            allergies were reviewed. The patient's tolerance of                            previous anesthesia was also reviewed. The risks                            and benefits of the procedure and the sedation                            options and risks were discussed with the patient.                            All questions were answered, and informed consent                            was obtained. Prior Anticoagulants: The patient has                            taken no previous anticoagulant or antiplatelet                            agents. ASA Grade Assessment: II - A patient with                            mild systemic disease. After reviewing the risks                            and benefits, the patient was deemed in                            satisfactory condition to undergo the procedure.                           After obtaining informed consent, the colonoscope  was passed under direct vision. Throughout the                            procedure, the patient's blood pressure, pulse, and                            oxygen saturations were monitored continuously. The                            Colonoscope was introduced through the anus and                            advanced to the 2 cm into the ileum. The                            colonoscopy was performed  without difficulty. The                            patient tolerated the procedure well. The quality                            of the bowel preparation was good. The terminal                            ileum, ileocecal valve, appendiceal orifice, and                            rectum were photographed. Scope In: 2:49:44 PM Scope Out: 3:03:16 PM Scope Withdrawal Time: 0 hours 9 minutes 37 seconds  Total Procedure Duration: 0 hours 13 minutes 32 seconds  Findings:                 Two sessile polyps were found in the mid descending                            colon and proximal transverse colon. The polyps                            were 4 to 6 mm in size. These polyps were removed                            with a cold snare. Resection and retrieval were                            complete.                           Many medium-mouthed diverticula were found in the                            sigmoid colon, descending colon, transverse colon,                            ascending colon and one in cecum.  Non-bleeding external and internal hemorrhoids were                            found during retroflexion and during perianal exam.                            The hemorrhoids were small.                           The terminal ileum appeared normal.                           The exam was otherwise without abnormality on                            direct and retroflexion views. Complications:            No immediate complications. Estimated Blood Loss:     Estimated blood loss: none. Impression:               - Two 4 to 6 mm polyps in the mid descending colon                            and in the proximal transverse colon, removed with                            a cold snare. Resected and retrieved.                           - Mild pancolonic diverticulosis.                           - Non-bleeding external and internal hemorrhoids.                           - The  examined portion of the ileum was normal.                           - The examination was otherwise normal on direct                            and retroflexion views. Recommendation:           - Patient has a contact number available for                            emergencies. The signs and symptoms of potential                            delayed complications were discussed with the                            patient. Return to normal activities tomorrow.                            Written discharge instructions were provided  to the                            patient.                           - High fiber diet.                           - Continue present medications.                           - Await pathology results.                           - Repeat colonoscopy for surveillance based on                            pathology results.                           - The findings and recommendations were discussed                            with the patient's family. Jackquline Denmark, MD 01/12/2022 3:08:18 PM This report has been signed electronically.

## 2022-01-12 NOTE — Progress Notes (Signed)
[facility] History and Physical   Primary Care Physician:  Luetta Nutting, DO   Reason for Procedure:    colorectal cancer screening  Plan:    Colonoscopy     HPI: Allen Mcpherson is a 46 y.o. male   No nausea, vomiting, heartburn, regurgitation, odynophagia or dysphagia.  No significant diarrhea or constipation.  No melena or hematochezia. No unintentional weight loss. No abdominal pain.  Past Medical History:  Diagnosis Date   Herpes labialis without complication 60/03/5408   Qualifier: Diagnosis of  By: Geoffry Paradise MD, Kristine Royal     Past Surgical History:  Procedure Laterality Date   INGUINAL HERNIA REPAIR Left    KIDNEY SURGERY     "tube from kidney to bladder- all internal-plastic"   UMBILICAL HERNIA REPAIR     WISDOM TOOTH EXTRACTION      Prior to Admission medications   Medication Sig Start Date End Date Taking? Authorizing Provider  valACYclovir (VALTREX) 1000 MG tablet 2 tablets twice daily for 1 day at onset of cold sores.Use as needed 11/10/21  Yes Luetta Nutting, DO  vitamin B-12 (CYANOCOBALAMIN) 500 MCG tablet Take 1,000 mcg by mouth daily.   Yes [provider]  VITAMIN D, CHOLECALCIFEROL, PO Take 1 tablet by mouth daily at 6 (six) AM.   Yes [provider]    Current Outpatient Medications  Medication Sig Dispense Refill   valACYclovir (VALTREX) 1000 MG tablet 2 tablets twice daily for 1 day at onset of cold sores.Use as needed 20 tablet 4   vitamin B-12 (CYANOCOBALAMIN) 500 MCG tablet Take 1,000 mcg by mouth daily.     VITAMIN D, CHOLECALCIFEROL, PO Take 1 tablet by mouth daily at 6 (six) AM.     Current Facility-Administered Medications  Medication Dose Route Frequency Provider Last Rate Last Admin   0.9 %  sodium chloride infusion  500 mL Intravenous Once Jackquline Denmark, MD        Allergies as of 01/12/2022   (No Known Allergies)    Family History  Problem Relation Age of Onset   Colon polyps Mother 58   Cancer Father     Cancer Maternal Grandmother    Cancer Maternal Grandfather    Cancer Paternal Grandmother    Cancer Paternal Grandfather    Colon cancer Neg Hx    Esophageal cancer Neg Hx    Rectal cancer Neg Hx    Stomach cancer Neg Hx     Social History   Socioeconomic History   Marital status: Married    Spouse name: Not on file   Number of children: Not on file   Years of education: Not on file   Highest education level: Not on file  Occupational History   Not on file  Tobacco Use   Smoking status: Former   Smokeless tobacco: Never  Vaping Use   Vaping Use: Never used  Substance and Sexual Activity   Alcohol use: Yes    Alcohol/week: 5.0 standard drinks    Types: 5 Standard drinks or equivalent per week   Drug use: Never   Sexual activity: Not on file  Other Topics Concern   Not on file  Social History Narrative   Not on file   Social Determinants of Health   Financial Resource Strain: Not on file  Food Insecurity: Not on file  Transportation Needs: Not on file  Physical Activity: Not on file  Stress: Not on file  Social Connections: Not on file  Intimate Partner Violence: Not  on file    Review of Systems: Positive for  none All other review of systems negative except as mentioned in the HPI.  Physical Exam: Vital signs in last 24 hours: @VSRANGES @   General:   Alert,  Well-developed, well-nourished, pleasant and cooperative in NAD Lungs:  Clear throughout to auscultation.   Heart:  Regular rate and rhythm; no murmurs, clicks, rubs,  or gallops. Abdomen:  Soft, nontender and nondistended. Normal bowel sounds.   Neuro/Psych:  Alert and cooperative. Normal mood and affect. A and O x 3    No significant changes were identified.  The patient continues to be an appropriate candidate for the planned procedure and anesthesia.   Carmell Austria, MD. [facility] 01/12/2022 2:39 PM@

## 2022-01-12 NOTE — Progress Notes (Signed)
Pt's states no medical or surgical changes since previsit or office visit. VS by AS

## 2022-01-12 NOTE — Progress Notes (Signed)
Called to room to assist during endoscopic procedure.  Patient ID and intended procedure confirmed with present staff. Received instructions for my participation in the procedure from the performing physician.  

## 2022-01-14 ENCOUNTER — Telehealth: Payer: Self-pay

## 2022-01-14 NOTE — Telephone Encounter (Signed)
°  Follow up Call-  Call back number 01/12/2022  Post procedure Call Back phone  # [phone]  Permission to leave phone message Yes  Some recent data might be hidden     Patient questions:  Do you have a fever, pain , or abdominal swelling? No. Pain Score  0 *  Have you tolerated food without any problems? Yes  Have you been able to return to your normal activities? Yes  Do you have any questions about your discharge instructions: Diet   No. Medications  No. Follow up visit  No.  Do you have questions or concerns about your Care? No.  Actions: * If pain score is 4 or above: No action needed, pain <4.  Have you developed a fever since your procedure? no  2.   Have you had an respiratory symptoms (SOB or cough) since your procedure? no  3.   Have you tested positive for COVID 19 since your procedure no  4.   Have you had any family members/close contacts diagnosed with the COVID 19 since your procedure?  no   If yes to any of these questions please route to Joylene John, RN and Joella Prince, RN

## 2022-01-22 ENCOUNTER — Encounter: Payer: Self-pay | Admitting: Gastroenterology

## 2022-11-10 ENCOUNTER — Ambulatory Visit (INDEPENDENT_AMBULATORY_CARE_PROVIDER_SITE_OTHER): Payer: BC Managed Care – PPO | Admitting: Family Medicine

## 2022-11-10 ENCOUNTER — Encounter: Payer: Self-pay | Admitting: Family Medicine

## 2022-11-10 VITALS — BP 124/80 | HR 73 | Ht 70.0 in | Wt 182.0 lb

## 2022-11-10 DIAGNOSIS — E782 Mixed hyperlipidemia: Secondary | ICD-10-CM

## 2022-11-10 DIAGNOSIS — Z0000 Encounter for general adult medical examination without abnormal findings: Secondary | ICD-10-CM | POA: Diagnosis not present

## 2022-11-10 DIAGNOSIS — E559 Vitamin D deficiency, unspecified: Secondary | ICD-10-CM | POA: Diagnosis not present

## 2022-11-10 DIAGNOSIS — Z3009 Encounter for other general counseling and advice on contraception: Secondary | ICD-10-CM | POA: Diagnosis not present

## 2022-11-10 NOTE — Assessment & Plan Note (Signed)
Well adult Orders Placed This Encounter  Procedures   COMPLETE METABOLIC PANEL WITH GFR   CBC with Differential   Lipid Panel w/reflex Direct LDL   Vitamin D (25 hydroxy)  Screenings: per lab orders Immunizations: declines Anticipatory guidance/Risk factor reduction:  Recommendations per AVS.

## 2022-11-10 NOTE — Progress Notes (Signed)
Allen Mcpherson - 46 y.o. male MRN [id number]  Date of birth: July 14, 1976  Subjective Chief Complaint  Patient presents with   Annual Exam    HPI Allen Mcpherson is a  46 y.o. male here today for annual exam.  He reports that he is doing well.   No new concerns today.  Continues on valtrex for cold sores as needed.  Interested in referral for vasectomy.   He continues to stay  pretty active.  He feels like his diet is pretty good  He is a non-smoker.  He consumes EtOH occasionally.   Colon cancer screening is UTD  He declines recommended immunizations  Review of Systems  Constitutional:  Negative for chills, fever, malaise/fatigue and weight loss.  HENT:  Negative for congestion, ear pain and sore throat.   Eyes:  Negative for blurred vision, double vision and pain.  Respiratory:  Negative for cough and shortness of breath.   Cardiovascular:  Negative for chest pain and palpitations.  Gastrointestinal:  Negative for abdominal pain, blood in stool, constipation, heartburn and nausea.  Genitourinary:  Negative for dysuria and urgency.  Musculoskeletal:  Negative for joint pain and myalgias.  Neurological:  Negative for dizziness and headaches.  Endo/Heme/Allergies:  Does not bruise/bleed easily.  Psychiatric/Behavioral:  Negative for depression. The patient is not nervous/anxious and does not have insomnia.     No Known Allergies  Past Medical History:  Diagnosis Date   Allen Mcpherson without complication 14/03/8184   Qualifier: Diagnosis of  By: Geoffry Paradise MD, Kristine Royal     Past Surgical History:  Procedure Laterality Date   INGUINAL HERNIA REPAIR Left    KIDNEY SURGERY     "tube from kidney to bladder- all internal-plastic"   UMBILICAL HERNIA REPAIR     WISDOM TOOTH EXTRACTION      Social History   Socioeconomic History   Marital status: Married    Spouse name: Not on file   Number of children: Not on file   Years of education: Not on file   Highest education level:  Not on file  Occupational History   Not on file  Tobacco Use   Smoking status: Former   Smokeless tobacco: Never  Vaping Use   Vaping Use: Never used  Substance and Sexual Activity   Alcohol use: Yes    Alcohol/week: 5.0 standard drinks of alcohol    Types: 5 Standard drinks or equivalent per week   Drug use: Never   Sexual activity: Not on file  Other Topics Concern   Not on file  Social History Narrative   Not on file   Social Determinants of Health   Financial Resource Strain: Not on file  Food Insecurity: Not on file  Transportation Needs: Not on file  Physical Activity: Sufficiently Active (10/22/2018)   Exercise Vital Sign    Days of Exercise per Week: 7 days    Minutes of Exercise per Session: 40 min  Stress: Not on file  Social Connections: Not on file    Family History  Problem Relation Age of Onset   Colon polyps Mother 38   Cancer Father    Cancer Maternal Grandmother    Cancer Maternal Grandfather    Cancer Paternal Grandmother    Cancer Paternal Grandfather    Colon cancer Neg Hx    Esophageal cancer Neg Hx    Rectal cancer Neg Hx    Stomach cancer Neg Hx     Health Maintenance  Topic Date Due  COVID-19 Vaccine (4 - 2023-24 season) 01/12/2023 (Originally 08/12/2022)   INFLUENZA VACCINE  03/12/2023 (Originally 07/12/2022)   Hepatitis C Screening  11/11/2023 (Originally 07/17/1994)   DTaP/Tdap/Td (4 - Td or Tdap) 07/25/2027   COLONOSCOPY (Pts 45-67yr Insurance coverage will need to be confirmed)  01/12/2029   HIV Screening  Completed   HPV VACCINES  Aged Out     ----------------------------------------------------------------------------------------------------------------------------------------------------------------------------------------------------------------- Physical Exam BP 124/80 (BP Location: Left Arm, Patient Position: Sitting, Cuff Size: Normal)   Pulse 73   Ht '5\' 10"'$  (1.778 m)   Wt 182 lb (82.6 kg)   SpO2 98%   BMI 26.11 kg/m    Physical Exam Constitutional:      General: He is not in acute distress. HENT:     Head: Normocephalic and atraumatic.     Right Ear: Tympanic membrane and external ear normal.     Left Ear: Tympanic membrane and external ear normal.  Eyes:     General: No scleral icterus. Neck:     Thyroid: No thyromegaly.  Cardiovascular:     Rate and Rhythm: Normal rate and regular rhythm.     Heart sounds: Normal heart sounds.  Pulmonary:     Effort: Pulmonary effort is normal.     Breath sounds: Normal breath sounds.  Abdominal:     General: Bowel sounds are normal. There is no distension.     Palpations: Abdomen is soft.     Tenderness: There is no abdominal tenderness. There is no guarding.  Musculoskeletal:     Cervical back: Normal range of motion.  Lymphadenopathy:     Cervical: No cervical adenopathy.  Skin:    General: Skin is warm and dry.     Findings: No rash.  Neurological:     Mental Status: He is alert and oriented to person, place, and time.     Cranial Nerves: No cranial nerve deficit.     Motor: No abnormal muscle tone.  Psychiatric:        Mood and Affect: Mood normal.        Behavior: Behavior normal.     ------------------------------------------------------------------------------------------------------------------------------------------------------------------------------------------------------------------- Assessment and Plan  Well adult exam Well adult Orders Placed This Encounter  Procedures   COMPLETE METABOLIC PANEL WITH GFR   CBC with Differential   Lipid Panel w/reflex Direct LDL   Vitamin D (25 hydroxy)  Screenings: per lab orders Immunizations: declines Anticipatory guidance/Risk factor reduction:  Recommendations per AVS.    No orders of the defined types were placed in this encounter.   No follow-ups on file.    This visit occurred during the SARS-CoV-2 public health emergency.  Safety protocols were in place, including screening  questions prior to the visit, additional usage of staff PPE, and extensive cleaning of exam room while observing appropriate contact time as indicated for disinfecting solutions.

## 2022-11-10 NOTE — Patient Instructions (Signed)

## 2022-11-11 LAB — CBC WITH DIFFERENTIAL/PLATELET
Absolute Monocytes: 440 cells/uL (ref 200–950)
Basophils Absolute: 28 cells/uL (ref 0–200)
Basophils Relative: 0.4 %
Eosinophils Absolute: 71 cells/uL (ref 15–500)
Eosinophils Relative: 1 %
HCT: 44.8 % (ref 38.5–50.0)
Hemoglobin: 15.7 g/dL (ref 13.2–17.1)
Lymphs Abs: 2194 cells/uL (ref 850–3900)
MCH: 31.8 pg (ref 27.0–33.0)
MCHC: 35 g/dL (ref 32.0–36.0)
MCV: 90.9 fL (ref 80.0–100.0)
MPV: 11.9 fL (ref 7.5–12.5)
Monocytes Relative: 6.2 %
Neutro Abs: 4367 cells/uL (ref 1500–7800)
Neutrophils Relative %: 61.5 %
Platelets: 246 10*3/uL (ref 140–400)
RBC: 4.93 10*6/uL (ref 4.20–5.80)
RDW: 11.8 % (ref 11.0–15.0)
Total Lymphocyte: 30.9 %
WBC: 7.1 10*3/uL (ref 3.8–10.8)

## 2022-11-11 LAB — COMPLETE METABOLIC PANEL WITH GFR
AG Ratio: 1.5 (calc) (ref 1.0–2.5)
ALT: 30 U/L (ref 9–46)
AST: 19 U/L (ref 10–40)
Albumin: 4.5 g/dL (ref 3.6–5.1)
Alkaline phosphatase (APISO): 136 U/L — ABNORMAL HIGH (ref 36–130)
BUN: 13 mg/dL (ref 7–25)
CO2: 25 mmol/L (ref 20–32)
Calcium: 9.5 mg/dL (ref 8.6–10.3)
Chloride: 104 mmol/L (ref 98–110)
Creat: 0.96 mg/dL (ref 0.60–1.29)
Globulin: 3.1 g/dL (calc) (ref 1.9–3.7)
Glucose, Bld: 93 mg/dL (ref 65–99)
Potassium: 4 mmol/L (ref 3.5–5.3)
Sodium: 141 mmol/L (ref 135–146)
Total Bilirubin: 0.4 mg/dL (ref 0.2–1.2)
Total Protein: 7.6 g/dL (ref 6.1–8.1)
eGFR: 99 mL/min/{1.73_m2} (ref >=60)

## 2022-11-11 LAB — LIPID PANEL W/REFLEX DIRECT LDL
Cholesterol: 231 mg/dL — ABNORMAL HIGH (ref <200)
HDL: 43 mg/dL (ref >=40)
LDL Cholesterol (Calc): 166 mg/dL (calc) — ABNORMAL HIGH
Non-HDL Cholesterol (Calc): 188 mg/dL (calc) — ABNORMAL HIGH (ref <130)
Total CHOL/HDL Ratio: 5.4 (calc) — ABNORMAL HIGH (ref <5.0)
Triglycerides: 104 mg/dL (ref <150)

## 2022-11-11 LAB — VITAMIN D 25 HYDROXY (VIT D DEFICIENCY, FRACTURES): Vit D, 25-Hydroxy: 25 ng/mL — ABNORMAL LOW (ref 30–100)

## 2023-01-15 ENCOUNTER — Other Ambulatory Visit: Payer: Self-pay | Admitting: Family Medicine

## 2023-02-14 ENCOUNTER — Encounter: Payer: Self-pay | Admitting: Family Medicine

## 2023-02-14 DIAGNOSIS — Z3009 Encounter for other general counseling and advice on contraception: Secondary | ICD-10-CM

## 2023-03-01 ENCOUNTER — Telehealth: Payer: Self-pay | Admitting: Urology

## 2023-03-01 ENCOUNTER — Ambulatory Visit (INDEPENDENT_AMBULATORY_CARE_PROVIDER_SITE_OTHER): Payer: BC Managed Care – PPO | Admitting: Urology

## 2023-03-01 ENCOUNTER — Encounter: Payer: Self-pay | Admitting: Urology

## 2023-03-01 VITALS — BP 131/90 | HR 89 | Ht 70.0 in | Wt 180.0 lb

## 2023-03-01 DIAGNOSIS — Z3009 Encounter for other general counseling and advice on contraception: Secondary | ICD-10-CM

## 2023-03-01 MED ORDER — ALPRAZOLAM 1 MG PO TABS
ORAL_TABLET | ORAL | 0 refills | Status: AC
Start: 2023-03-01 — End: ?

## 2023-03-01 NOTE — Patient Instructions (Signed)

## 2023-03-01 NOTE — Telephone Encounter (Signed)
Patient has been scheduled for Vasectomy on 04/07/2023 with Dr Felipa Eth.

## 2023-03-01 NOTE — Progress Notes (Signed)
Assessment: 1. Encounter for vasectomy assessment     Plan: Schedule for vasectomy per patient request Rx for alprazolam 1 mg pre-procedure provided.  Chief Complaint:  Chief Complaint  Patient presents with   VAS Consult    History of Present Illness:  Allen Mcpherson is a 47 y.o. male who is seen for vasectomy evaluation.  He is married with no children.  No history of scrotal trauma or infection.   Past Medical History:  Past Medical History:  Diagnosis Date   Herpes labialis without complication [id number]   Qualifier: Diagnosis of  By: Geoffry Paradise MD, Kristine Royal     Past Surgical History:  Past Surgical History:  Procedure Laterality Date   INGUINAL HERNIA REPAIR Left    KIDNEY SURGERY     "tube from kidney to bladder- all internal-plastic"   UMBILICAL HERNIA REPAIR     WISDOM TOOTH EXTRACTION      Allergies:  No Known Allergies  Family History:  Family History  Problem Relation Age of Onset   Colon polyps Mother 32   Cancer Father    Cancer Maternal Grandmother    Cancer Maternal Grandfather    Cancer Paternal Grandmother    Cancer Paternal Grandfather    Colon cancer Neg Hx    Esophageal cancer Neg Hx    Rectal cancer Neg Hx    Stomach cancer Neg Hx     Social History:  Social History   Tobacco Use   Smoking status: Former   Smokeless tobacco: Never  Scientific laboratory technician Use: Never used  Substance Use Topics   Alcohol use: Yes    Alcohol/week: 5.0 standard drinks of alcohol    Types: 5 Standard drinks or equivalent per week   Drug use: Never    Review of symptoms:  Constitutional:  Negative for unexplained weight loss, night sweats, fever, chills ENT:  Negative for nose bleeds, sinus pain, painful swallowing CV:  Negative for chest pain, shortness of breath, exercise intolerance, palpitations, loss of consciousness Resp:  Negative for cough, wheezing, shortness of breath GI:  Negative for nausea, vomiting, diarrhea, bloody stools GU:   Positives noted in HPI; otherwise negative for gross hematuria, dysuria, urinary incontinence Neuro:  Negative for seizures, poor balance, limb weakness, slurred speech Psych:  Negative for lack of energy, depression, anxiety Endocrine:  Negative for polydipsia, polyuria, symptoms of hypoglycemia (dizziness, hunger, sweating) Hematologic:  Negative for anemia, purpura, petechia, prolonged or excessive bleeding, use of anticoagulants  Allergic:  Negative for difficulty breathing or choking as a result of exposure to anything; no shellfish allergy; no allergic response (rash/itch) to materials, foods  Physical exam: BP (!) 131/90   Pulse 89   Ht 5\' 10"  (1.778 m)   Wt 180 lb (81.6 kg)   BMI 25.83 kg/m  GENERAL APPEARANCE:  Well appearing, well developed, well nourished, NAD HEENT:  Atraumatic, normocephalic, oropharynx clear NECK:  Supple without lymphadenopathy or thyromegaly ABDOMEN:  Soft, non-tender, no masses EXTREMITIES:  Moves all extremities well, without clubbing, cyanosis, or edema NEUROLOGIC:  Alert and oriented x 3, normal gait, CN II-XII grossly intact MENTAL STATUS:  appropriate BACK:  Non-tender to palpation, No CVAT SKIN:  Warm, dry, and intact GU: Penis:  circumcised Meatus: Normal Scrotum: normal, vas palpated bilaterally Testis: normal without masses bilateral Epididymis: normal  Results: None  VASECTOMY CONSULTATION  [name] presents for vasectomy consultation today.  He is a 47 y.o. male, Married with no  children .  He and  his wife have discussed the issues regarding long-term fertility and are comfortable with this decision.  He presents for consideration for vasectomy.  I discussed the issues in detail with him today and he expressed no reservations.  As to the procedure, no scalpel technique vasectomy is explained and reviewed in detail.  Generalized risks including but not limited to bleeding, infection, orchalgia, testicular atrophy, epididymitis,  scrotal hematoma, and chronic pain are discussed.   Additionally, he understands that the possibility of vas recanalization following vasectomy is possible although rare.  Most importantly, the patient understands that he is not sterile initially and will need a semen analysis check to confirm sterility such that no sperm are seen.  He is advised to avoid ejaculation for 10 days following the procedure.  The initial semen analysis will be checked in approximately 12 weeks and in some patients, several months may be required for clearance of all sperm.  He reports a clear understanding of the need for continued birth control until sterility is confirmed.  Otherwise, general issues regarding local anesthesia, prep, alprazolam are discussed and he reports a clear understanding.

## 2023-04-07 ENCOUNTER — Encounter: Payer: Self-pay | Admitting: Urology

## 2023-04-07 ENCOUNTER — Ambulatory Visit: Payer: BC Managed Care – PPO | Admitting: Urology

## 2023-04-07 VITALS — BP 132/93 | HR 92 | Ht 70.0 in | Wt 180.0 lb

## 2023-04-07 DIAGNOSIS — Z302 Encounter for sterilization: Secondary | ICD-10-CM

## 2023-04-07 MED ORDER — CEPHALEXIN 500 MG PO CAPS: 500.0000 mg | ORAL_CAPSULE | Freq: Three times a day (TID) | ORAL | 0 refills | Status: AC

## 2023-04-07 MED ORDER — HYDROCODONE-ACETAMINOPHEN 5-325 MG PO TABS
1.0000 | ORAL_TABLET | Freq: Four times a day (QID) | ORAL | 0 refills | Status: AC | PRN
Start: 2023-04-07 — End: ?

## 2023-04-07 NOTE — Progress Notes (Signed)
Assessment: 1. Encounter for vasectomy    Plan: Post vasectomy instructions given Rx sent Post vasectomy semen analysis in 12 weeks  Chief Complaint:  Chief Complaint  Patient presents with   VAS    History of Present Illness:  Allen Mcpherson is a 47 y.o. male who is seen for vasectomy.  He is married with no children.  No history of scrotal trauma or infection.   Past Medical History:  Past Medical History:  Diagnosis Date   Herpes labialis without complication 10/20/2008   Qualifier: Diagnosis of  By: Alto Denver MD, Grayland Ormond     Past Surgical History:  Past Surgical History:  Procedure Laterality Date   INGUINAL HERNIA REPAIR Left    KIDNEY SURGERY     "tube from kidney to bladder- all internal-plastic"   UMBILICAL HERNIA REPAIR     WISDOM TOOTH EXTRACTION      Allergies:  No Known Allergies  Family History:  Family History  Problem Relation Age of Onset   Colon polyps Mother 59   Cancer Father    Cancer Maternal Grandmother    Cancer Maternal Grandfather    Cancer Paternal Grandmother    Cancer Paternal Grandfather    Colon cancer Neg Hx    Esophageal cancer Neg Hx    Rectal cancer Neg Hx    Stomach cancer Neg Hx     Social History:  Social History   Tobacco Use   Smoking status: Former   Smokeless tobacco: Never  Building services engineer Use: Never used  Substance Use Topics   Alcohol use: Yes    Alcohol/week: 5.0 standard drinks of alcohol    Types: 5 Standard drinks or equivalent per week   Drug use: Never    ROS: Constitutional:  Negative for fever, chills, weight loss CV: Negative for chest pain, previous MI, hypertension Respiratory:  Negative for shortness of breath, wheezing, sleep apnea, frequent cough GI:  Negative for nausea, vomiting, bloody stool, GERD  Physical exam: BP (!) 132/93   Pulse 92   Ht 5\' 10"  (1.778 m)   Wt 180 lb (81.6 kg)   BMI 25.83 kg/m  GENERAL APPEARANCE:  Well appearing, well developed, well nourished,  NAD HEENT:  Atraumatic, normocephalic, oropharynx clear NECK:  Supple without lymphadenopathy or thyromegaly ABDOMEN:  Soft, non-tender, no masses EXTREMITIES:  Moves all extremities well, without clubbing, cyanosis, or edema NEUROLOGIC:  Alert and oriented x 3, normal gait, CN II-XII grossly intact MENTAL STATUS:  appropriate BACK:  Non-tender to palpation, No CVAT SKIN:  Warm, dry, and intact  Results: None  VASECTOMY PROCEDURE:  Allen Mcpherson presents for vasectomy following previous vasectomy consultation and permit is signed.  The patient's anterior scrotal wall is shaved and prepped with Betadine in standard sterile fashion.  1% lidocaine is used as local anesthetic in the scrotal and peri vasal tissue.  A standard median raphe punch incision is made and a no scalpel technique vasectomy is performed.  Bilateral vas are isolated from the peri vasal tissue and an approximately 1 cm segment of vas is excised.  Proximal and distal segments are internally cauterized with electric heat cautery. Interposition of perivasal tissue was performed.  Bilateral palpation confirms bilateral vasectomy defect and no significant bleeding or hematoma is identified.  Neosporin gauze dressing and a scrotal support are applied.    Disposition: Patient is discharged home with Rx for pain medication and antibiotics.  Patient is given routine vasectomy instructions.   Most importantly, he  is instructed and cautioned again regarding the need for protected intercourse until such time that a single  negative semen analysis has been obtained.  The initial semen analysis will be checked in approximately 12  weeks.  The patient reports a clear understanding.  He will call with any interval questions or concerns.

## 2023-04-07 NOTE — Patient Instructions (Signed)

## 2023-06-28 ENCOUNTER — Other Ambulatory Visit: Payer: Self-pay

## 2023-06-28 DIAGNOSIS — Z302 Encounter for sterilization: Secondary | ICD-10-CM

## 2023-07-10 ENCOUNTER — Other Ambulatory Visit: Payer: BC Managed Care – PPO

## 2023-07-10 ENCOUNTER — Other Ambulatory Visit: Payer: Self-pay

## 2023-07-10 DIAGNOSIS — Z302 Encounter for sterilization: Secondary | ICD-10-CM

## 2023-11-22 ENCOUNTER — Encounter: Payer: BC Managed Care – PPO | Admitting: Family Medicine

## 2023-11-29 ENCOUNTER — Encounter: Payer: Self-pay | Admitting: Family Medicine

## 2023-11-29 ENCOUNTER — Ambulatory Visit (INDEPENDENT_AMBULATORY_CARE_PROVIDER_SITE_OTHER): Payer: BC Managed Care – PPO | Admitting: Family Medicine

## 2023-11-29 VITALS — BP 123/84 | HR 80 | Ht 71.0 in | Wt 180.2 lb

## 2023-11-29 DIAGNOSIS — E559 Vitamin D deficiency, unspecified: Secondary | ICD-10-CM | POA: Diagnosis not present

## 2023-11-29 DIAGNOSIS — R351 Nocturia: Secondary | ICD-10-CM

## 2023-11-29 DIAGNOSIS — E782 Mixed hyperlipidemia: Secondary | ICD-10-CM

## 2023-11-29 DIAGNOSIS — Z0000 Encounter for general adult medical examination without abnormal findings: Secondary | ICD-10-CM

## 2023-11-29 MED ORDER — VALACYCLOVIR HCL 1 G PO TABS: ORAL_TABLET | ORAL | 2 refills | Status: AC

## 2023-11-29 NOTE — Patient Instructions (Signed)

## 2023-11-29 NOTE — Progress Notes (Signed)
Allen Mcpherson - 47 y.o. male MRN [id number]  Date of birth: Jun 09, 1976  Subjective Chief Complaint  Patient presents with   Annual Exam    HPI Allen Mcpherson is a 47 y.o. male here today for annual exam.   He reports that he is doing pretty well   He is moderately active.  He feels that diet is pretty good.   He is a non-smoker.  Occasional EtOH use.   Review of Systems  Constitutional:  Negative for chills, fever, malaise/fatigue and weight loss.  HENT:  Negative for congestion, ear pain and sore throat.   Eyes:  Negative for blurred vision, double vision and pain.  Respiratory:  Negative for cough and shortness of breath.   Cardiovascular:  Negative for chest pain and palpitations.  Gastrointestinal:  Negative for abdominal pain, blood in stool, constipation, heartburn and nausea.  Genitourinary:  Negative for dysuria and urgency.  Musculoskeletal:  Negative for joint pain and myalgias.  Neurological:  Negative for dizziness and headaches.  Endo/Heme/Allergies:  Does not bruise/bleed easily.  Psychiatric/Behavioral:  Negative for depression. The patient is not nervous/anxious and does not have insomnia.     No Known Allergies  Past Medical History:  Diagnosis Date   Herpes labialis without complication 10/20/2008   Qualifier: Diagnosis of  By: Alto Denver MD, Grayland Ormond     Past Surgical History:  Procedure Laterality Date   INGUINAL HERNIA REPAIR Left    KIDNEY SURGERY     "tube from kidney to bladder- all internal-plastic"   UMBILICAL HERNIA REPAIR     WISDOM TOOTH EXTRACTION      Social History   Socioeconomic History   Marital status: Married    Spouse name: Not on file   Number of children: Not on file   Years of education: Not on file   Highest education level: Bachelor's degree (e.g., BA, AB, BS)  Occupational History   Not on file  Tobacco Use   Smoking status: Former   Smokeless tobacco: Never  Vaping Use   Vaping status: Never Used  Substance and  Sexual Activity   Alcohol use: Yes    Alcohol/week: 5.0 standard drinks of alcohol    Types: 5 Standard drinks or equivalent per week   Drug use: Never   Sexual activity: Not on file  Other Topics Concern   Not on file  Social History Narrative   Not on file   Social Drivers of Health   Financial Resource Strain: Low Risk  (11/25/2023)   Overall Financial Resource Strain (CARDIA)    Difficulty of Paying Living Expenses: Not hard at all  Food Insecurity: No Food Insecurity (11/25/2023)   Hunger Vital Sign    Worried About Running Out of Food in the Last Year: Never true    Ran Out of Food in the Last Year: Never true  Transportation Needs: No Transportation Needs (11/25/2023)   PRAPARE - Administrator, Civil Service (Medical): No    Lack of Transportation (Non-Medical): No  Physical Activity: Sufficiently Active (11/25/2023)   Exercise Vital Sign    Days of Exercise per Week: 4 days    Minutes of Exercise per Session: 40 min  Stress: No Stress Concern Present (11/25/2023)   [employer] of Occupational Health - Occupational Stress Questionnaire    Feeling of Stress : Not at all  Social Connections: Unknown (11/25/2023)   Social Connection and Isolation Panel [NHANES]    Frequency of Communication with Friends and Family: More  than three times a week    Frequency of Social Gatherings with Friends and Family: Twice a week    Attends Religious Services: Patient declined    Database administrator or Organizations: No    Attends Engineer, structural: Not on file    Marital Status: Married    Family History  Problem Relation Age of Onset   Colon polyps Mother 66   Cancer Father    Cancer Maternal Grandmother    Cancer Maternal Grandfather    Cancer Paternal Grandmother    Cancer Paternal Grandfather    Colon cancer Neg Hx    Esophageal cancer Neg Hx    Rectal cancer Neg Hx    Stomach cancer Neg Hx     Health Maintenance  Topic Date Due    Hepatitis C Screening  Never done   COVID-19 Vaccine (4 - 2024-25 season) 12/15/2023 (Originally 08/13/2023)   INFLUENZA VACCINE  03/11/2024 (Originally 07/13/2023)   DTaP/Tdap/Td (4 - Td or Tdap) 07/25/2027   Colonoscopy  01/12/2029   HIV Screening  Completed   HPV VACCINES  Aged Out     ----------------------------------------------------------------------------------------------------------------------------------------------------------------------------------------------------------------- Physical Exam BP 123/84 (BP Location: Left Arm, Patient Position: Sitting, Cuff Size: Normal)   Pulse 80   Ht 5\' 11"  (1.803 m)   Wt 180 lb 4 oz (81.8 kg)   SpO2 97%   BMI 25.14 kg/m   Physical Exam Constitutional:      General: He is not in acute distress. HENT:     Head: Normocephalic and atraumatic.     Right Ear: Tympanic membrane and external ear normal.     Left Ear: Tympanic membrane and external ear normal.  Eyes:     General: No scleral icterus. Neck:     Thyroid: No thyromegaly.  Cardiovascular:     Rate and Rhythm: Normal rate and regular rhythm.     Heart sounds: Normal heart sounds.  Pulmonary:     Effort: Pulmonary effort is normal.     Breath sounds: Normal breath sounds.  Abdominal:     General: Bowel sounds are normal. There is no distension.     Palpations: Abdomen is soft.     Tenderness: There is no abdominal tenderness. There is no guarding.  Musculoskeletal:     Cervical back: Normal range of motion.  Lymphadenopathy:     Cervical: No cervical adenopathy.  Skin:    General: Skin is warm and dry.     Findings: No rash.  Neurological:     Mental Status: He is alert and oriented to person, place, and time.     Cranial Nerves: No cranial nerve deficit.     Motor: No abnormal muscle tone.  Psychiatric:        Mood and Affect: Mood normal.        Behavior: Behavior normal.      ------------------------------------------------------------------------------------------------------------------------------------------------------------------------------------------------------------------- Assessment and Plan  Well adult exam Well adult Orders Placed This Encounter  Procedures   CMP14+EGFR   CBC with Differential/Platelet   Lipid Panel With LDL/HDL Ratio   Vitamin D (25 hydroxy)   PSA  Screenings: per lab orders Immunizations: declines Anticipatory guidance/Risk factor reduction:  Recommendations per AVS.    No orders of the defined types were placed in this encounter.   No follow-ups on file.    This visit occurred during the SARS-CoV-2 public health emergency.  Safety protocols were in place, including screening questions prior to the visit, additional usage of staff PPE, and  extensive cleaning of exam room while observing appropriate contact time as indicated for disinfecting solutions.

## 2023-11-29 NOTE — Assessment & Plan Note (Signed)
Well adult Orders Placed This Encounter  Procedures   CMP14+EGFR   CBC with Differential/Platelet   Lipid Panel With LDL/HDL Ratio   Vitamin D (25 hydroxy)   PSA  Screenings: per lab orders Immunizations: declines Anticipatory guidance/Risk factor reduction:  Recommendations per AVS.

## 2023-11-30 LAB — CMP14+EGFR
ALT: 29 [IU]/L (ref 0–44)
AST: 23 [IU]/L (ref 0–40)
Albumin: 4.4 g/dL (ref 4.1–5.1)
Alkaline Phosphatase: 163 [IU]/L — ABNORMAL HIGH (ref 44–121)
BUN/Creatinine Ratio: 11 (ref 9–20)
BUN: 12 mg/dL (ref 6–24)
Bilirubin Total: 0.4 mg/dL (ref 0.0–1.2)
CO2: 22 mmol/L (ref 20–29)
Calcium: 9.6 mg/dL (ref 8.7–10.2)
Chloride: 104 mmol/L (ref 96–106)
Creatinine, Ser: 1.08 mg/dL (ref 0.76–1.27)
Globulin, Total: 2.5 g/dL (ref 1.5–4.5)
Glucose: 93 mg/dL (ref 70–99)
Potassium: 4.4 mmol/L (ref 3.5–5.2)
Sodium: 142 mmol/L (ref 134–144)
Total Protein: 6.9 g/dL (ref 6.0–8.5)
eGFR: 85 mL/min/{1.73_m2} (ref >59)

## 2023-11-30 LAB — CBC WITH DIFFERENTIAL/PLATELET
Basophils Absolute: 0.1 10*3/uL (ref 0.0–0.2)
Basos: 1 %
EOS (ABSOLUTE): 0.1 10*3/uL (ref 0.0–0.4)
Eos: 1 %
Hematocrit: 43.5 % (ref 37.5–51.0)
Hemoglobin: 14.6 g/dL (ref 13.0–17.7)
Immature Grans (Abs): 0 10*3/uL (ref 0.0–0.1)
Immature Granulocytes: 0 %
Lymphocytes Absolute: 2.3 10*3/uL (ref 0.7–3.1)
Lymphs: 35 %
MCH: 31.5 pg (ref 26.6–33.0)
MCHC: 33.6 g/dL (ref 31.5–35.7)
MCV: 94 fL (ref 79–97)
Monocytes Absolute: 0.6 10*3/uL (ref 0.1–0.9)
Monocytes: 8 %
Neutrophils Absolute: 3.5 10*3/uL (ref 1.4–7.0)
Neutrophils: 55 %
Platelets: 234 10*3/uL (ref 150–450)
RBC: 4.63 x10E6/uL (ref 4.14–5.80)
RDW: 12 % (ref 11.6–15.4)
WBC: 6.5 10*3/uL (ref 3.4–10.8)

## 2023-11-30 LAB — LIPID PANEL WITH LDL/HDL RATIO
Cholesterol, Total: 195 mg/dL (ref 100–199)
HDL: 43 mg/dL (ref >39)
LDL Chol Calc (NIH): 135 mg/dL — ABNORMAL HIGH (ref 0–99)
LDL/HDL Ratio: 3.1 {ratio} (ref 0.0–3.6)
Triglycerides: 92 mg/dL (ref 0–149)
VLDL Cholesterol Cal: 17 mg/dL (ref 5–40)

## 2023-11-30 LAB — VITAMIN D 25 HYDROXY (VIT D DEFICIENCY, FRACTURES): Vit D, 25-Hydroxy: 48.7 ng/mL (ref 30.0–100.0)

## 2023-11-30 LAB — PSA: Prostate Specific Ag, Serum: 1.1 ng/mL (ref 0.0–4.0)

## 2024-07-11 ENCOUNTER — Ambulatory Visit
Admission: RE | Admit: 2024-07-11 | Discharge: 2024-07-11 | Disposition: A | Discharge status: Home / Self Care | Source: Ambulatory Visit | Attending: Family Medicine | Admitting: Family Medicine

## 2024-07-11 VITALS — BP 119/81 | HR 99 | Temp 98.7°F | Resp 16

## 2024-07-11 DIAGNOSIS — K122 Cellulitis and abscess of mouth: Secondary | ICD-10-CM

## 2024-07-11 MED ORDER — PREDNISONE 20 MG PO TABS: ORAL_TABLET | ORAL | 0 refills | Status: DC

## 2024-07-11 MED ORDER — AMOXICILLIN-POT CLAVULANATE 875-125 MG PO TABS: 1.0000 | ORAL_TABLET | Freq: Two times a day (BID) | ORAL | 0 refills | Status: DC

## 2024-07-11 NOTE — ED Triage Notes (Signed)
 Pt c/o sore throat and HA since yesterday. No known fever. Taking Alkaseltzer and advil prn.

## 2024-07-11 NOTE — Discharge Instructions (Addendum)
 Advised patient to take medications as directed with food to completion.  Advised patient to take prednisone with first dose of Augmentin for the next 5 of 7 days.  Encouraged to increase daily water intake to 64 ounces per day while taking these medications.  Advised if symptoms worsen and/or unresolved please follow-up with your PCP or here for further evaluation.

## 2024-07-11 NOTE — ED Provider Notes (Signed)
 [facility]    CSN: [id number] Arrival date & time: 07/11/24  1042      History   Chief Complaint Chief Complaint  Patient presents with   Sore Throat    Entered by patient    HPI Allen Mcpherson is a 48 y.o. male.   HPI 48 year old male presents with sore throat for days  Past Medical History:  Diagnosis Date   Herpes labialis without complication 10/20/2008   Qualifier: Diagnosis of  By: Perley MD, Ronal Dines     Patient Active Problem List   Diagnosis Date Noted   Well adult exam 10/21/2020   Mixed hyperlipidemia 10/22/2018   Vitamin D  deficiency 04/17/2016   Herpes labialis without complication 10/20/2008    Past Surgical History:  Procedure Laterality Date   INGUINAL HERNIA REPAIR Left    KIDNEY SURGERY     tube from kidney to bladder- all internal-plastic   UMBILICAL HERNIA REPAIR     WISDOM TOOTH EXTRACTION         Home Medications    Prior to Admission medications   Medication Sig Start Date End Date Taking? Authorizing Provider  amoxicillin -clavulanate (AUGMENTIN ) 875-125 MG tablet Take 1 tablet by mouth every 12 (twelve) hours. 07/11/24  Yes Teddy Sharper, FNP  predniSONE  (DELTASONE ) 20 MG tablet Take 3 tabs PO daily x 5 days. 07/11/24  Yes Teddy Sharper, FNP  ALPRAZolam  (XANAX ) 1 MG tablet Take 1 tablet by mouth 1 hour prior to procedure 03/01/23   Stoneking, Adine PARAS., MD  HYDROcodone -acetaminophen  (NORCO/VICODIN) 5-325 MG tablet Take 1 tablet by mouth every 6 (six) hours as needed for moderate pain. 04/07/23   Stoneking, Adine PARAS., MD  valACYclovir  (VALTREX ) 1000 MG tablet TAKE 2 TABLETS BY MOUTH TWICE DAILY FOR 1 DAY AT  ONSET  OF  COLD  SORES.  USE  AS  DIRECTED. 11/29/23   Alvia Bring, DO  vitamin B-12 (CYANOCOBALAMIN) 500 MCG tablet Take 1,000 mcg by mouth daily.    [provider]  VITAMIN D , CHOLECALCIFEROL, PO Take 1 tablet by mouth daily at 6 (six) AM.    [provider]    Family History Family History   Problem Relation Age of Onset   Colon polyps Mother 23   Cancer Father    Cancer Maternal Grandmother    Cancer Maternal Grandfather    Cancer Paternal Grandmother    Cancer Paternal Grandfather    Colon cancer Neg Hx    Esophageal cancer Neg Hx    Rectal cancer Neg Hx    Stomach cancer Neg Hx     Social History Social History   Tobacco Use   Smoking status: Former   Smokeless tobacco: Never  Vaping Use   Vaping status: Never Used  Substance Use Topics   Alcohol use: Yes    Alcohol/week: 5.0 standard drinks of alcohol    Types: 5 Standard drinks or equivalent per week   Drug use: Never     Allergies   Patient has no known allergies.   Review of Systems Review of Systems  HENT:  Positive for sore throat.   Neurological:  Positive for headaches.  All other systems reviewed and are negative.    Physical Exam Triage Vital Signs ED Triage Vitals  Encounter Vitals Group     BP      Girls Systolic BP Percentile      Girls Diastolic BP Percentile      Boys Systolic BP Percentile  Boys Diastolic BP Percentile      Pulse      Resp      Temp      Temp src      SpO2      Weight      Height      Head Circumference      Peak Flow      Pain Score      Pain Loc      Pain Education      Exclude from Growth Chart    No data found.  Updated Vital Signs BP 119/81 (BP Location: Right Arm)   Pulse 99   Temp 98.7 F (37.1 C) (Oral)   Resp 16   SpO2 95%    Physical Exam Vitals reviewed.  Constitutional:      Appearance: He is well-developed and normal weight. He is ill-appearing.  HENT:     Head: Normocephalic and atraumatic.     Right Ear: Tympanic membrane and ear canal normal.     Left Ear: Tympanic membrane and ear canal normal.     Mouth/Throat:     Mouth: Mucous membranes are moist.     Pharynx: Oropharynx is clear. Uvula midline. Posterior oropharyngeal erythema and uvula swelling present.     Tonsils: Tonsillar exudate present. 1+ on the  left.  Eyes:     Conjunctiva/sclera: Conjunctivae normal.     Pupils: Pupils are equal, round, and reactive to light.  Cardiovascular:     Rate and Rhythm: Normal rate and regular rhythm.     Heart sounds: Normal heart sounds.  Pulmonary:     Effort: Pulmonary effort is normal.     Breath sounds: Normal breath sounds. No wheezing, rhonchi or rales.  Musculoskeletal:     Cervical back: Normal range of motion and neck supple.  Lymphadenopathy:     Cervical: Cervical adenopathy present.  Skin:    General: Skin is warm and dry.  Neurological:     General: No focal deficit present.     Mental Status: He is alert and oriented to person, place, and time.      UC Treatments / Results  Labs (all labs ordered are listed, but only abnormal results are displayed) Labs Reviewed - No data to display  EKG   Radiology No results found.  Procedures Procedures (including critical care time)  Medications Ordered in UC Medications - No data to display  Initial Impression / Assessment and Plan / UC Course  I have reviewed the triage vital signs and the nursing notes.  Pertinent labs & imaging results that were available during my care of the patient were reviewed by me and considered in my medical decision making (see chart for details).     MDM: 1.  Uvulitis-Rx'd Augmentin  875/125 mg tablet: Take 1 tablet twice daily x 7 days, Rx'd prednisone  20 mg tablet: Take 3 tablets p.o. daily x 5 days. Advised patient to take medications as directed with food to completion.  Advised patient to take prednisone  with first dose of Augmentin  for the next 5 of 7 days.  Encouraged to increase daily water intake to 64 ounces per day while taking these medications.  Advised if symptoms worsen and/or unresolved please follow-up with your PCP or here for further evaluation.  Patient discharged home, hemodynamically stable.  Work note provided to patient per her request prior to discharge. Final Clinical  Impressions(s) / UC Diagnoses   Final diagnoses:  Uvulitis     Discharge  Instructions      Advised patient to take medications as directed with food to completion.  Advised patient to take prednisone  with first dose of Augmentin  for the next 5 of 7 days.  Encouraged to increase daily water intake to 64 ounces per day while taking these medications.  Advised if symptoms worsen and/or unresolved please follow-up with your PCP or here for further evaluation.     ED Prescriptions     Medication Sig Dispense Auth. Provider   amoxicillin -clavulanate (AUGMENTIN ) 875-125 MG tablet Take 1 tablet by mouth every 12 (twelve) hours. 14 tablet Judge Duque, FNP   predniSONE  (DELTASONE ) 20 MG tablet Take 3 tabs PO daily x 5 days. 15 tablet Arne Schlender, FNP      PDMP not reviewed this encounter.   Teddy Sharper, FNP 07/11/24 1116

## 2024-11-29 ENCOUNTER — Encounter: Payer: Self-pay | Admitting: Family Medicine

## 2024-11-29 ENCOUNTER — Ambulatory Visit: Admitting: Family Medicine

## 2024-11-29 VITALS — BP 122/82 | HR 78 | Ht 71.0 in | Wt 179.0 lb

## 2024-11-29 DIAGNOSIS — E559 Vitamin D deficiency, unspecified: Secondary | ICD-10-CM

## 2024-11-29 DIAGNOSIS — Z13220 Encounter for screening for lipoid disorders: Secondary | ICD-10-CM

## 2024-11-29 DIAGNOSIS — Z0000 Encounter for general adult medical examination without abnormal findings: Secondary | ICD-10-CM | POA: Diagnosis not present

## 2024-11-29 DIAGNOSIS — Z125 Encounter for screening for malignant neoplasm of prostate: Secondary | ICD-10-CM

## 2024-11-29 NOTE — Patient Instructions (Signed)

## 2024-11-29 NOTE — Assessment & Plan Note (Signed)
 Well adult Orders Placed This Encounter  Procedures   CMP14+EGFR   CBC with Differential/Platelet   Lipid Panel With LDL/HDL Ratio   Vitamin D (25 hydroxy)   PSA  Screenings: per lab orders Immunizations: declines Anticipatory guidance/Risk factor reduction:  Recommendations per AVS.

## 2024-11-29 NOTE — Progress Notes (Signed)
 " Allen Mcpherson - 48 y.o. male MRN [id number]  Date of birth: 09-Sep-1976  Subjective Chief Complaint  Patient presents with   Annual Exam    HPI Allen Mcpherson is a 48 y.o. male here today for annual exam.   He reports that he is doing well.   He remains pretty active.  He feels that he is doing pretty good with diet.   He is a non-smoker.  Occasional EtOH  He does have regular dental care.   Review of Systems  Constitutional:  Negative for chills, fever, malaise/fatigue and weight loss.  HENT:  Negative for congestion, ear pain and sore throat.   Eyes:  Negative for blurred vision, double vision and pain.  Respiratory:  Negative for cough and shortness of breath.   Cardiovascular:  Negative for chest pain and palpitations.  Gastrointestinal:  Negative for abdominal pain, blood in stool, constipation, heartburn and nausea.  Genitourinary:  Negative for dysuria and urgency.  Musculoskeletal:  Negative for joint pain and myalgias.  Neurological:  Negative for dizziness and headaches.  Endo/Heme/Allergies:  Does not bruise/bleed easily.  Psychiatric/Behavioral:  Negative for depression. The patient is not nervous/anxious and does not have insomnia.     Allergies[1]  Past Medical History:  Diagnosis Date   Herpes labialis without complication 10/20/2008   Qualifier: Diagnosis of  By: Perley MD, Ronal Dines     Past Surgical History:  Procedure Laterality Date   INGUINAL HERNIA REPAIR Left    KIDNEY SURGERY     tube from kidney to bladder- all internal-plastic   UMBILICAL HERNIA REPAIR     WISDOM TOOTH EXTRACTION      Social History   Socioeconomic History   Marital status: Married    Spouse name: Not on file   Number of children: Not on file   Years of education: Not on file   Highest education level: Bachelor's degree (e.g., BA, AB, BS)  Occupational History   Not on file  Tobacco Use   Smoking status: Former   Smokeless tobacco: Never  Vaping Use   Vaping  status: Never Used  Substance and Sexual Activity   Alcohol use: Yes    Alcohol/week: 5.0 standard drinks of alcohol    Types: 5 Standard drinks or equivalent per week   Drug use: Never   Sexual activity: Not on file  Other Topics Concern   Not on file  Social History Narrative   Not on file   Social Drivers of Health   Tobacco Use: Medium Risk (11/29/2024)   Patient History    Smoking Tobacco Use: Former    Smokeless Tobacco Use: Never    Passive Exposure: Not on file  Financial Resource Strain: Low Risk (11/25/2024)   Overall Financial Resource Strain (CARDIA)    Difficulty of Paying Living Expenses: Not hard at all  Food Insecurity: No Food Insecurity (11/25/2024)   Epic    Worried About Radiation Protection Practitioner of Food in the Last Year: Never true    Ran Out of Food in the Last Year: Never true  Transportation Needs: No Transportation Needs (11/25/2024)   Epic    Lack of Transportation (Medical): No    Lack of Transportation (Non-Medical): No  Physical Activity: Sufficiently Active (11/25/2024)   Exercise Vital Sign    Days of Exercise per Week: 5 days    Minutes of Exercise per Session: 30 min  Stress: No Stress Concern Present (11/25/2024)   [employer] Occupational [employer] - Occupational Stress Questionnaire  Feeling of Stress: Not at all  Social Connections: Unknown (11/25/2024)   Social Connection and Isolation Panel    Frequency of Communication with Friends and Family: Not on file    Frequency of Social Gatherings with Friends and Family: Patient declined    Attends Religious Services: Patient declined    Active Member of Clubs or Organizations: Patient declined    Attends Banker Meetings: Not on file    Marital Status: Married  Depression (PHQ2-9): Low Risk (11/29/2023)   Depression (PHQ2-9)    PHQ-2 Score: 0  Alcohol Screen: Low Risk (11/25/2024)   Alcohol Screen    Last Alcohol Screening Score (AUDIT): 3  Housing: Unknown (11/25/2024)    Epic    Unable to Pay for Housing in the Last Year: Patient declined    Number of Times Moved in the Last Year: Not on file    Homeless in the Last Year: No  Utilities: Not on file  Health Literacy: Not on file    Family History  Problem Relation Age of Onset   Colon polyps Mother 21   Cancer Father    Cancer Maternal Grandmother    Cancer Maternal Grandfather    Cancer Paternal Grandmother    Cancer Paternal Grandfather    Colon cancer Neg Hx    Esophageal cancer Neg Hx    Rectal cancer Neg Hx    Stomach cancer Neg Hx     Health Maintenance  Topic Date Due   Hepatitis C Screening  Never done   Hepatitis B Vaccines 19-59 Average Risk (1 of 3 - 19+ 3-dose series) Never done   COVID-19 Vaccine (4 - 2025-26 season) 08/12/2024   Influenza Vaccine  03/11/2025 (Originally 07/12/2024)   DTaP/Tdap/Td (4 - Td or Tdap) 07/25/2027   Colonoscopy  01/12/2029   HIV Screening  Completed   Pneumococcal Vaccine  Aged Out   HPV VACCINES  Aged Out   Meningococcal B Vaccine  Aged Out     ----------------------------------------------------------------------------------------------------------------------------------------------------------------------------------------------------------------- Physical Exam BP 122/82 (BP Location: Left Arm, Patient Position: Sitting, Cuff Size: Normal)   Pulse 78   Ht 5' 11 (1.803 m)   Wt 179 lb (81.2 kg)   SpO2 96%   BMI 24.97 kg/m   Physical Exam Constitutional:      General: He is not in acute distress. HENT:     Head: Normocephalic and atraumatic.     Right Ear: Tympanic membrane and external ear normal.     Left Ear: Tympanic membrane and external ear normal.  Eyes:     General: No scleral icterus. Neck:     Thyroid: No thyromegaly.  Cardiovascular:     Rate and Rhythm: Normal rate and regular rhythm.     Heart sounds: Normal heart sounds.  Pulmonary:     Effort: Pulmonary effort is normal.     Breath sounds: Normal breath sounds.   Abdominal:     General: Bowel sounds are normal. There is no distension.     Palpations: Abdomen is soft.     Tenderness: There is no abdominal tenderness. There is no guarding.  Musculoskeletal:     Cervical back: Normal range of motion.  Lymphadenopathy:     Cervical: No cervical adenopathy.  Skin:    General: Skin is warm and dry.     Findings: No rash.  Neurological:     Mental Status: He is alert and oriented to person, place, and time.     Cranial Nerves: No cranial nerve  deficit.     Motor: No abnormal muscle tone.  Psychiatric:        Mood and Affect: Mood normal.        Behavior: Behavior normal.     ------------------------------------------------------------------------------------------------------------------------------------------------------------------------------------------------------------------- Assessment and Plan  Well adult exam Well adult Orders Placed This Encounter  Procedures   CMP14+EGFR   CBC with Differential/Platelet   Lipid Panel With LDL/HDL Ratio   Vitamin D  (25 hydroxy)   PSA  Screenings: per lab orders Immunizations: declines Anticipatory guidance/Risk factor reduction:  Recommendations per AVS.    No orders of the defined types were placed in this encounter.   No follow-ups on file.        [1] No Known Allergies  "

## 2024-11-30 LAB — CBC WITH DIFFERENTIAL/PLATELET
Basophils Absolute: 0 x10E3/uL (ref 0.0–0.2)
Basos: 1 %
EOS (ABSOLUTE): 0.1 x10E3/uL (ref 0.0–0.4)
Eos: 1 %
Hematocrit: 46.9 % (ref 37.5–51.0)
Hemoglobin: 16.2 g/dL (ref 13.0–17.7)
Immature Grans (Abs): 0 x10E3/uL (ref 0.0–0.1)
Immature Granulocytes: 0 %
Lymphocytes Absolute: 2.4 x10E3/uL (ref 0.7–3.1)
Lymphs: 33 %
MCH: 32.1 pg (ref 26.6–33.0)
MCHC: 34.5 g/dL (ref 31.5–35.7)
MCV: 93 fL (ref 79–97)
Monocytes Absolute: 0.6 x10E3/uL (ref 0.1–0.9)
Monocytes: 8 %
Neutrophils Absolute: 4.1 x10E3/uL (ref 1.4–7.0)
Neutrophils: 57 %
Platelets: 240 x10E3/uL (ref 150–450)
RBC: 5.05 x10E6/uL (ref 4.14–5.80)
RDW: 12 % (ref 11.6–15.4)
WBC: 7.2 x10E3/uL (ref 3.4–10.8)

## 2024-11-30 LAB — CMP14+EGFR
ALT: 23 IU/L (ref 0–44)
AST: 22 IU/L (ref 0–40)
Albumin: 4.5 g/dL (ref 4.1–5.1)
Alkaline Phosphatase: 165 IU/L — ABNORMAL HIGH (ref 47–123)
BUN/Creatinine Ratio: 14 (ref 9–20)
BUN: 16 mg/dL (ref 6–24)
Bilirubin Total: 0.6 mg/dL (ref 0.0–1.2)
CO2: 24 mmol/L (ref 20–29)
Calcium: 9.8 mg/dL (ref 8.7–10.2)
Chloride: 101 mmol/L (ref 96–106)
Creatinine, Ser: 1.11 mg/dL (ref 0.76–1.27)
Globulin, Total: 2.9 g/dL (ref 1.5–4.5)
Glucose: 92 mg/dL (ref 70–99)
Potassium: 4.4 mmol/L (ref 3.5–5.2)
Sodium: 138 mmol/L (ref 134–144)
Total Protein: 7.4 g/dL (ref 6.0–8.5)
eGFR: 82 mL/min/1.73

## 2024-11-30 LAB — LIPID PANEL WITH LDL/HDL RATIO
Cholesterol, Total: 206 mg/dL — ABNORMAL HIGH (ref 100–199)
HDL: 42 mg/dL
LDL Chol Calc (NIH): 144 mg/dL — ABNORMAL HIGH (ref 0–99)
LDL/HDL Ratio: 3.4 ratio (ref 0.0–3.6)
Triglycerides: 111 mg/dL (ref 0–149)
VLDL Cholesterol Cal: 20 mg/dL (ref 5–40)

## 2024-11-30 LAB — PSA: Prostate Specific Ag, Serum: 1.3 ng/mL (ref 0.0–4.0)

## 2024-11-30 LAB — VITAMIN D 25 HYDROXY (VIT D DEFICIENCY, FRACTURES): Vit D, 25-Hydroxy: 40.1 ng/mL (ref 30.0–100.0)

## 2024-12-05 ENCOUNTER — Ambulatory Visit: Payer: Self-pay | Admitting: Family Medicine
